# Patient Record
Sex: Male | Born: 1935 | Race: White | Hispanic: No | Marital: Married
Health system: Southern US, Community
[De-identification: ages and names within clinical notes are randomized; demographics above are authoritative.]

---

## 2003-11-01 ENCOUNTER — Ambulatory Visit: Payer: Self-pay | Admitting: Unknown Physician Specialty

## 2005-08-13 ENCOUNTER — Other Ambulatory Visit: Payer: Self-pay

## 2005-08-13 ENCOUNTER — Inpatient Hospital Stay: Payer: Self-pay | Admitting: Internal Medicine

## 2006-03-24 ENCOUNTER — Inpatient Hospital Stay: Payer: Self-pay | Admitting: Internal Medicine

## 2006-11-17 ENCOUNTER — Inpatient Hospital Stay: Payer: Self-pay | Admitting: Internal Medicine

## 2006-11-17 ENCOUNTER — Other Ambulatory Visit: Payer: Self-pay

## 2007-04-16 ENCOUNTER — Other Ambulatory Visit: Payer: Self-pay

## 2007-04-16 ENCOUNTER — Inpatient Hospital Stay: Payer: Self-pay | Admitting: Internal Medicine

## 2007-04-19 ENCOUNTER — Other Ambulatory Visit: Payer: Self-pay

## 2007-04-22 ENCOUNTER — Inpatient Hospital Stay: Payer: Self-pay | Admitting: Internal Medicine

## 2007-04-22 ENCOUNTER — Other Ambulatory Visit: Payer: Self-pay

## 2007-07-10 ENCOUNTER — Other Ambulatory Visit: Payer: Self-pay

## 2007-07-10 ENCOUNTER — Inpatient Hospital Stay: Payer: Self-pay | Admitting: Rheumatology

## 2007-09-01 ENCOUNTER — Emergency Department: Payer: Self-pay | Admitting: Emergency Medicine

## 2007-09-01 ENCOUNTER — Other Ambulatory Visit: Payer: Self-pay

## 2008-02-19 ENCOUNTER — Ambulatory Visit: Payer: Self-pay | Admitting: Internal Medicine

## 2008-11-24 ENCOUNTER — Ambulatory Visit: Payer: Self-pay | Admitting: Internal Medicine

## 2009-09-12 ENCOUNTER — Ambulatory Visit: Payer: Self-pay | Admitting: Internal Medicine

## 2011-02-12 LAB — URINALYSIS, COMPLETE
Bacteria: NONE SEEN
Bilirubin,UR: NEGATIVE
Blood: NEGATIVE
Glucose,UR: NEGATIVE mg/dL (ref 0–75)
Hyaline Cast: 10
Leukocyte Esterase: NEGATIVE
Nitrite: NEGATIVE
Ph: 5 (ref 4.5–8.0)
RBC,UR: <1 /HPF (ref 0–5)
Specific Gravity: 1.017 (ref 1.003–1.030)
Squamous Epithelial: <1

## 2011-02-12 LAB — CBC
HCT: 37.4 % — ABNORMAL LOW (ref 40.0–52.0)
HGB: 12.4 g/dL — ABNORMAL LOW (ref 13.0–18.0)
MCH: 30.2 pg (ref 26.0–34.0)
MCHC: 33.2 g/dL (ref 32.0–36.0)
MCV: 91 fL (ref 80–100)
Platelet: 153 10*3/uL (ref 150–440)
RBC: 4.11 10*6/uL — ABNORMAL LOW (ref 4.40–5.90)

## 2011-02-12 LAB — COMPREHENSIVE METABOLIC PANEL
Albumin: 4 g/dL (ref 3.4–5.0)
BUN: 39 mg/dL — ABNORMAL HIGH (ref 7–18)
Bilirubin,Total: 0.5 mg/dL (ref 0.2–1.0)
Chloride: 109 mmol/L — ABNORMAL HIGH (ref 98–107)
Creatinine: 2.31 mg/dL — ABNORMAL HIGH (ref 0.60–1.30)
EGFR (African American): 35 — ABNORMAL LOW
Glucose: 115 mg/dL — ABNORMAL HIGH (ref 65–99)
Potassium: 4.5 mmol/L (ref 3.5–5.1)
SGPT (ALT): 21 U/L
Total Protein: 7.8 g/dL (ref 6.4–8.2)

## 2011-02-12 LAB — TROPONIN I: Troponin-I: <0.02 ng/mL

## 2011-02-13 LAB — LIPID PANEL
Cholesterol: 112 mg/dL (ref 0–200)
Triglycerides: 161 mg/dL (ref 0–200)
VLDL Cholesterol, Calc: 32 mg/dL (ref 5–40)

## 2011-02-13 LAB — BASIC METABOLIC PANEL
Anion Gap: 10 (ref 7–16)
BUN: 39 mg/dL — ABNORMAL HIGH (ref 7–18)
Creatinine: 1.98 mg/dL — ABNORMAL HIGH (ref 0.60–1.30)
EGFR (African American): 42 — ABNORMAL LOW
EGFR (Non-African Amer.): 35 — ABNORMAL LOW
Sodium: 147 mmol/L — ABNORMAL HIGH (ref 136–145)

## 2011-02-14 ENCOUNTER — Inpatient Hospital Stay: Payer: Self-pay | Admitting: Internal Medicine

## 2011-02-14 LAB — CBC WITH DIFFERENTIAL/PLATELET
Basophil #: 0 10*3/uL (ref 0.0–0.1)
Basophil %: 0.3 %
HCT: 32.3 % — ABNORMAL LOW (ref 40.0–52.0)
HGB: 10.9 g/dL — ABNORMAL LOW (ref 13.0–18.0)
Lymphocyte #: 1.5 10*3/uL (ref 1.0–3.6)
Lymphocyte %: 28.5 %
MCH: 30.7 pg (ref 26.0–34.0)
MCV: 91 fL (ref 80–100)
Monocyte %: 10.6 %
Neutrophil #: 3.1 10*3/uL (ref 1.4–6.5)
Neutrophil %: 58 %
RDW: 13.7 % (ref 11.5–14.5)
WBC: 5.4 10*3/uL (ref 3.8–10.6)

## 2011-02-14 LAB — BASIC METABOLIC PANEL
Anion Gap: 10 (ref 7–16)
Calcium, Total: 8.3 mg/dL — ABNORMAL LOW (ref 8.5–10.1)
Chloride: 110 mmol/L — ABNORMAL HIGH (ref 98–107)
Co2: 26 mmol/L (ref 21–32)
Creatinine: 1.61 mg/dL — ABNORMAL HIGH (ref 0.60–1.30)
Osmolality: 297 (ref 275–301)
Potassium: 4.3 mmol/L (ref 3.5–5.1)

## 2012-01-29 ENCOUNTER — Emergency Department: Payer: Self-pay | Admitting: Emergency Medicine

## 2012-01-29 LAB — COMPREHENSIVE METABOLIC PANEL
Anion Gap: 6 — ABNORMAL LOW (ref 7–16)
Bilirubin,Total: 0.5 mg/dL (ref 0.2–1.0)
Chloride: 108 mmol/L — ABNORMAL HIGH (ref 98–107)
Co2: 27 mmol/L (ref 21–32)
Creatinine: 2.09 mg/dL — ABNORMAL HIGH (ref 0.60–1.30)
EGFR (African American): 33 — ABNORMAL LOW
EGFR (Non-African Amer.): 28 — ABNORMAL LOW
Osmolality: 293 (ref 275–301)
SGOT(AST): 21 U/L (ref 15–37)
Sodium: 141 mmol/L (ref 136–145)

## 2012-01-29 LAB — CBC
HCT: 38.6 % — ABNORMAL LOW (ref 40.0–52.0)
MCH: 30.5 pg (ref 26.0–34.0)
Platelet: 165 10*3/uL (ref 150–440)
RBC: 4.27 10*6/uL — ABNORMAL LOW (ref 4.40–5.90)
RDW: 13.9 % (ref 11.5–14.5)

## 2012-04-24 ENCOUNTER — Ambulatory Visit: Payer: Self-pay

## 2012-12-19 IMAGING — CT CT PELVIS W/O CM
1 series · 16 of 32 positions shown, 20 images · non-contrast
Comparison: None

REASON FOR EXAM: abdominal mass
COMMENTS:

PROCEDURE:     CT  - CT PELVIS STANDARD WO  - February 14, 2011 [DATE]
RESULT:     History: Abdominal mass
TECHNIQUE: Multiple axial images obtained of the pelvis with oral contrast
only.

[Series 3: soft tissue · axial · 0.74mm/px · z∈[-577,-312]mm · 16 of 59 slices shown, 20 images]
[im 4/59  soft-tissue]
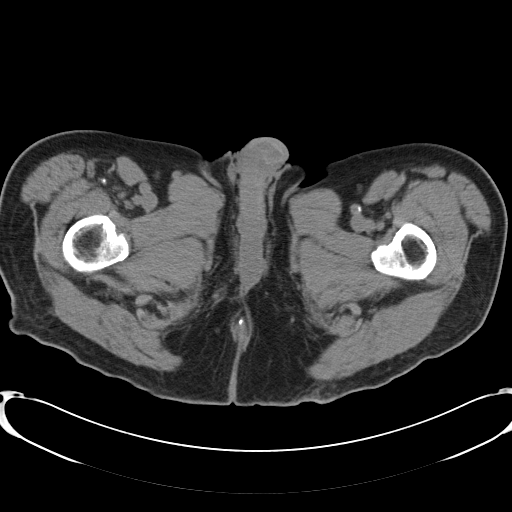
[im 4/59  bone]
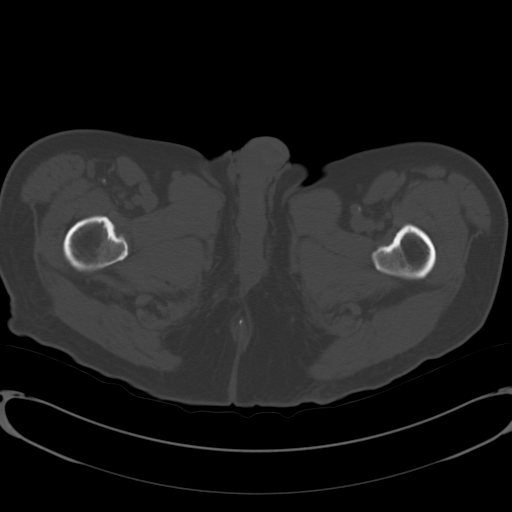
[im 8/59  soft-tissue]
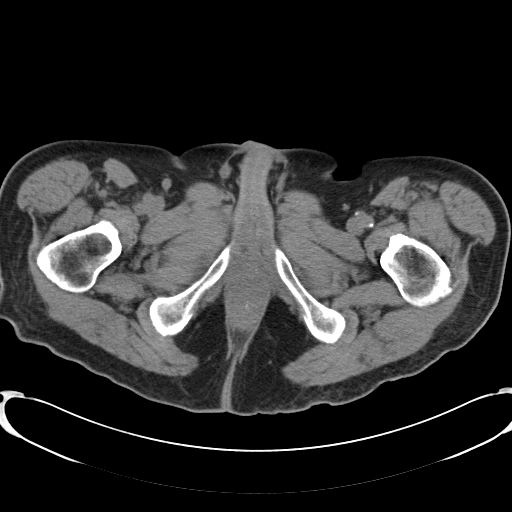
[im 12/59  soft-tissue]
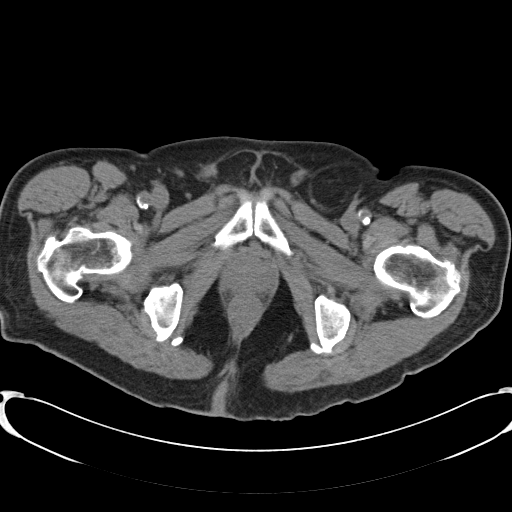
[im 15/59  soft-tissue]
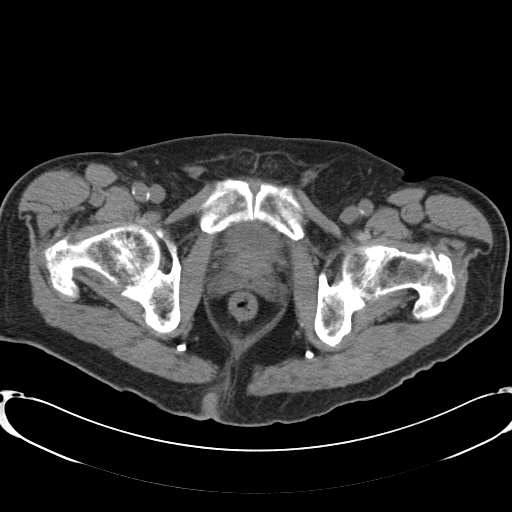
[im 19/59  soft-tissue]
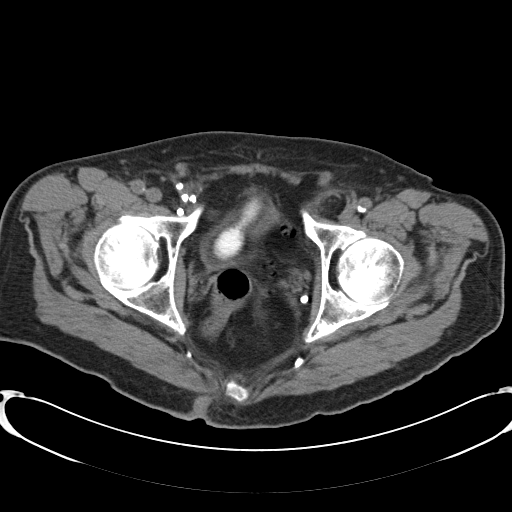
[im 23/59  soft-tissue]
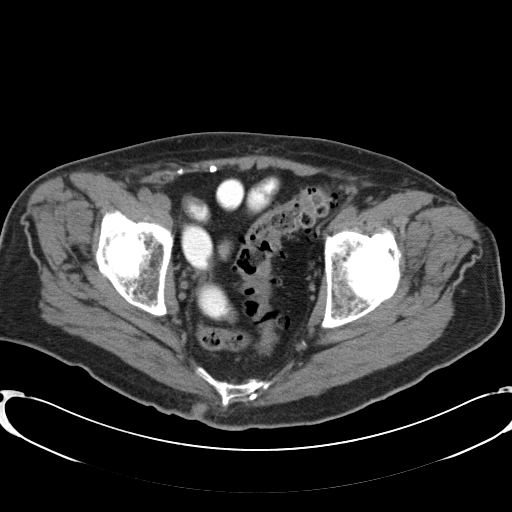
[im 27/59  soft-tissue]
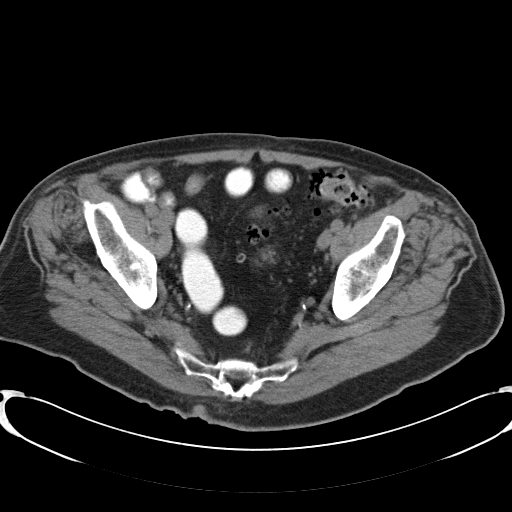
[im 32/59  soft-tissue]
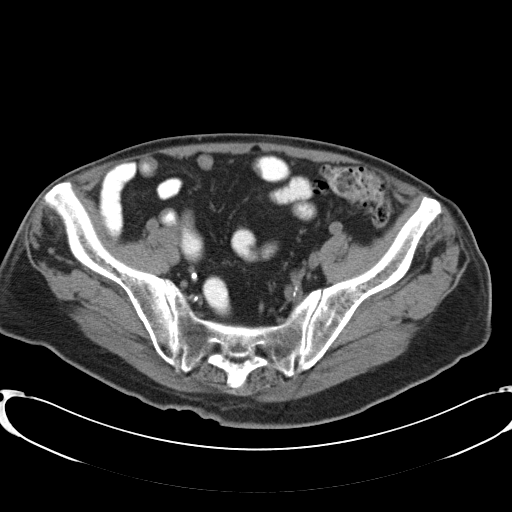
[im 36/59  soft-tissue]
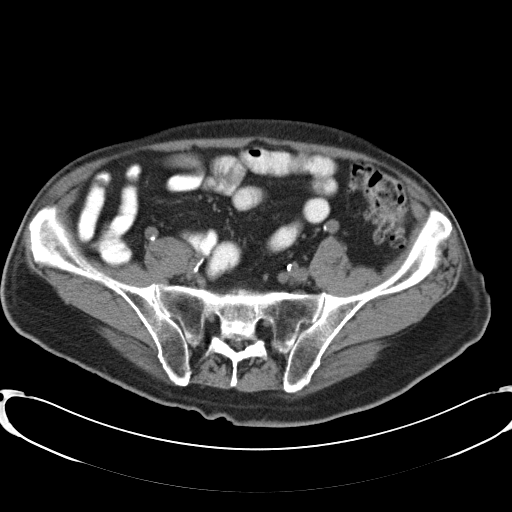
[im 36/59  bone]
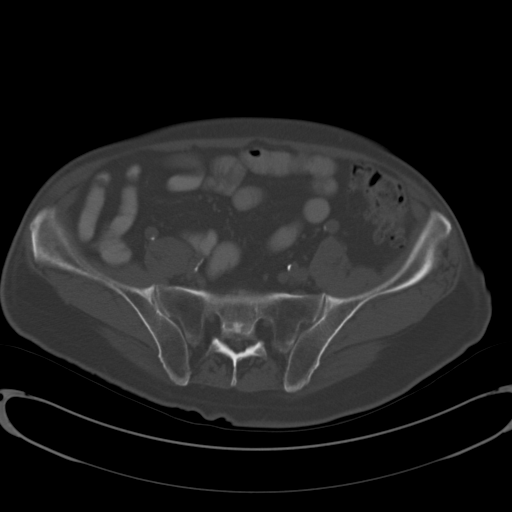
[im 40/59  soft-tissue]
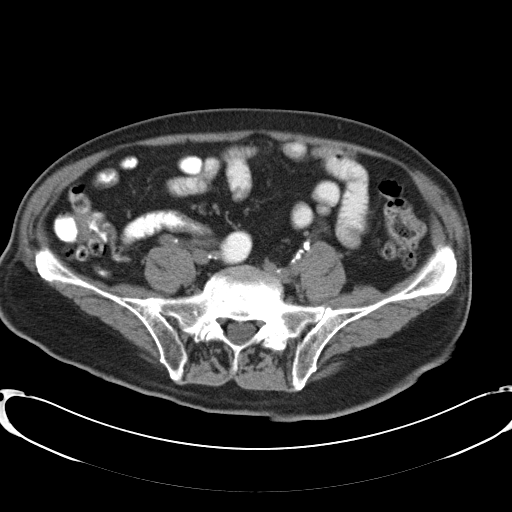
[im 44/59  soft-tissue]
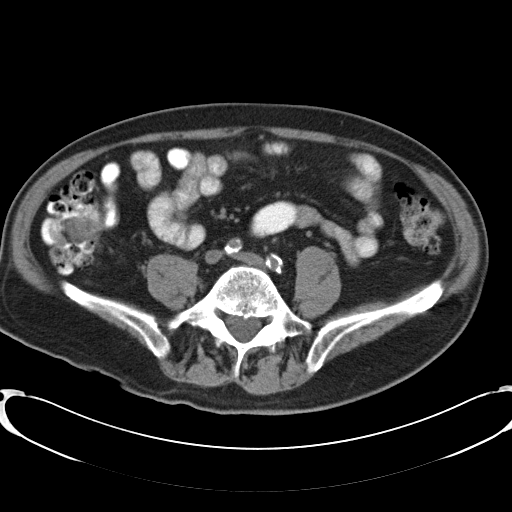
[im 47/59  soft-tissue]
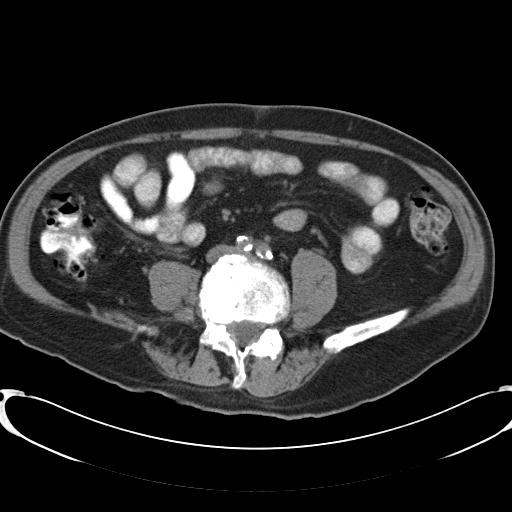
[im 51/59  soft-tissue]
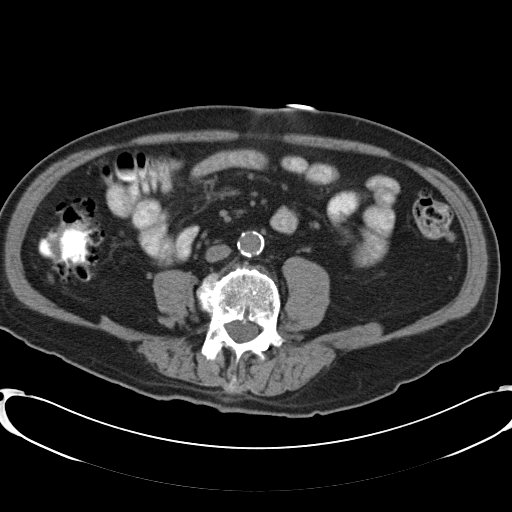
[im 51/59  lung]
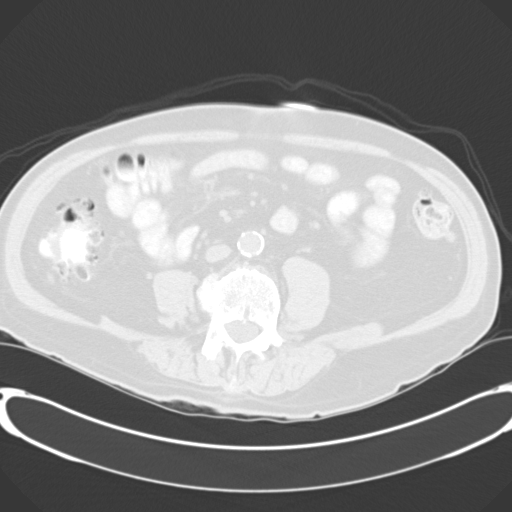
[im 53/59  lung]
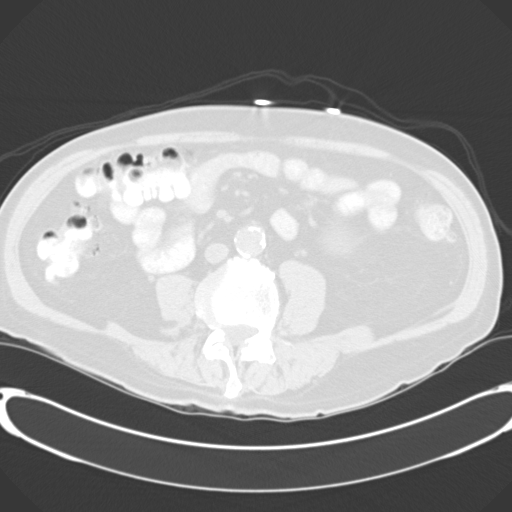
[im 55/59  soft-tissue]
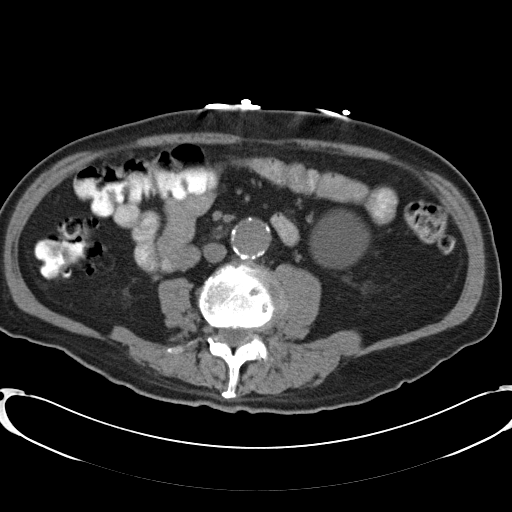
[im 55/59  lung]
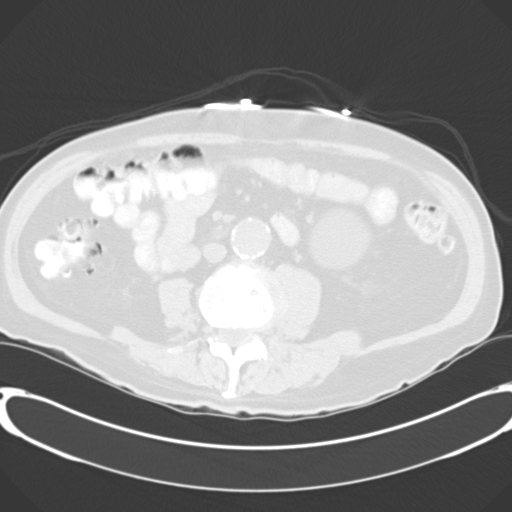
[im 57/59  lung]
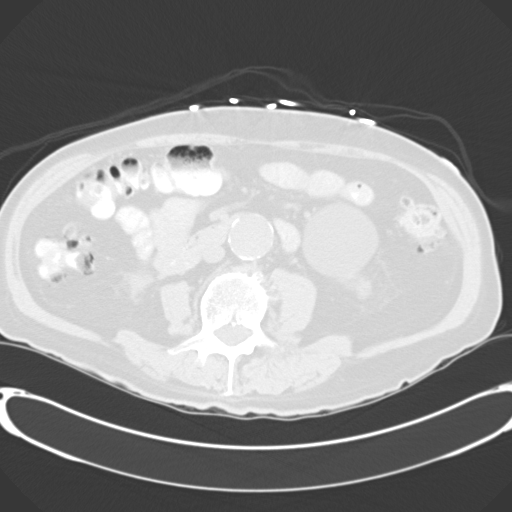

[16 of 32 positions shown; findings below may reference images not displayed]

FINDINGS: There is diverticulosis without evidence of diverticulitis. There is a
cm hypodense, fluid attenuating left inferior pole renal mass most
consistent with a cyst. The bladder is partially decompressed. There is no
evidence of a mass adjacent to the bladder as seen on prior ultrasound.
IMPRESSION: There is no evidence of a mass adjacent to the bladder as seen on prior
ultrasound. This may have represented a fluid-filled bowel loop adjacent to
the bladder on the ultrasound.

## 2013-06-16 ENCOUNTER — Ambulatory Visit: Payer: Self-pay

## 2013-06-29 ENCOUNTER — Ambulatory Visit: Payer: Self-pay | Admitting: Urology

## 2013-06-29 ENCOUNTER — Ambulatory Visit: Payer: Self-pay | Admitting: Radiation Oncology

## 2013-06-29 LAB — BASIC METABOLIC PANEL
Anion Gap: 7 (ref 7–16)
BUN: 49 mg/dL — ABNORMAL HIGH (ref 7–18)
CREATININE: 2.8 mg/dL — AB (ref 0.60–1.30)
Calcium, Total: 8.7 mg/dL (ref 8.5–10.1)
Chloride: 109 mmol/L — ABNORMAL HIGH (ref 98–107)
Co2: 27 mmol/L (ref 21–32)
EGFR (Non-African Amer.): 20 — ABNORMAL LOW
GFR CALC AF AMER: 23 — AB
Glucose: 111 mg/dL — ABNORMAL HIGH (ref 65–99)
OSMOLALITY: 299 (ref 275–301)
POTASSIUM: 4.7 mmol/L (ref 3.5–5.1)
Sodium: 143 mmol/L (ref 136–145)

## 2013-06-29 LAB — CBC WITH DIFFERENTIAL/PLATELET
Basophil #: 0 10*3/uL (ref 0.0–0.1)
Basophil %: 0.4 %
EOS PCT: 0.7 %
Eosinophil #: 0 10*3/uL (ref 0.0–0.7)
HCT: 34.9 % — ABNORMAL LOW (ref 40.0–52.0)
HGB: 11.9 g/dL — ABNORMAL LOW (ref 13.0–18.0)
Lymphocyte #: 1.2 10*3/uL (ref 1.0–3.6)
Lymphocyte %: 22.7 %
MCH: 30.9 pg (ref 26.0–34.0)
MCHC: 34 g/dL (ref 32.0–36.0)
MCV: 91 fL (ref 80–100)
MONO ABS: 0.6 x10 3/mm (ref 0.2–1.0)
Monocyte %: 10.7 %
NEUTROS ABS: 3.5 10*3/uL (ref 1.4–6.5)
Neutrophil %: 65.5 %
Platelet: 126 10*3/uL — ABNORMAL LOW (ref 150–440)
RBC: 3.84 10*6/uL — AB (ref 4.40–5.90)
RDW: 14.3 % (ref 11.5–14.5)
WBC: 5.3 10*3/uL (ref 3.8–10.6)

## 2013-07-01 ENCOUNTER — Emergency Department: Payer: Self-pay | Admitting: Emergency Medicine

## 2013-07-01 ENCOUNTER — Ambulatory Visit: Payer: Self-pay | Admitting: Urology

## 2013-07-02 LAB — PATHOLOGY REPORT

## 2013-07-21 ENCOUNTER — Ambulatory Visit: Payer: Self-pay | Admitting: Radiation Oncology

## 2013-07-28 LAB — COMPREHENSIVE METABOLIC PANEL
ALBUMIN: 3.8 g/dL (ref 3.4–5.0)
ANION GAP: 8 (ref 7–16)
Alkaline Phosphatase: 86 U/L
BUN: 49 mg/dL — AB (ref 7–18)
Bilirubin,Total: 0.5 mg/dL (ref 0.2–1.0)
CALCIUM: 9 mg/dL (ref 8.5–10.1)
CREATININE: 2.55 mg/dL — AB (ref 0.60–1.30)
Chloride: 107 mmol/L (ref 98–107)
Co2: 29 mmol/L (ref 21–32)
EGFR (African American): 26 — ABNORMAL LOW
GFR CALC NON AF AMER: 22 — AB
Glucose: 113 mg/dL — ABNORMAL HIGH (ref 65–99)
Osmolality: 301 (ref 275–301)
POTASSIUM: 5.5 mmol/L — AB (ref 3.5–5.1)
SGOT(AST): 24 U/L (ref 15–37)
SGPT (ALT): 20 U/L (ref 12–78)
SODIUM: 144 mmol/L (ref 136–145)
Total Protein: 7.5 g/dL (ref 6.4–8.2)

## 2013-07-28 LAB — CBC CANCER CENTER
Basophil #: 0 x10 3/mm (ref 0.0–0.1)
Basophil %: 0.6 %
Eosinophil #: 0.2 x10 3/mm (ref 0.0–0.7)
Eosinophil %: 3.9 %
HCT: 37.7 % — AB (ref 40.0–52.0)
HGB: 12.5 g/dL — ABNORMAL LOW (ref 13.0–18.0)
LYMPHS ABS: 1.5 x10 3/mm (ref 1.0–3.6)
LYMPHS PCT: 26.4 %
MCH: 30.1 pg (ref 26.0–34.0)
MCHC: 33.3 g/dL (ref 32.0–36.0)
MCV: 91 fL (ref 80–100)
MONO ABS: 0.6 x10 3/mm (ref 0.2–1.0)
MONOS PCT: 10.8 %
NEUTROS ABS: 3.3 x10 3/mm (ref 1.4–6.5)
NEUTROS PCT: 58.3 %
Platelet: 143 x10 3/mm — ABNORMAL LOW (ref 150–440)
RBC: 4.16 10*6/uL — ABNORMAL LOW (ref 4.40–5.90)
RDW: 13.8 % (ref 11.5–14.5)
WBC: 5.6 x10 3/mm (ref 3.8–10.6)

## 2013-07-29 ENCOUNTER — Ambulatory Visit: Payer: Self-pay | Admitting: Oncology

## 2013-07-29 ENCOUNTER — Ambulatory Visit: Payer: Self-pay | Admitting: Radiation Oncology

## 2013-08-12 LAB — CBC CANCER CENTER
Basophil #: 0 x10 3/mm (ref 0.0–0.1)
Basophil %: 0.6 %
Eosinophil #: 0.2 x10 3/mm (ref 0.0–0.7)
Eosinophil %: 3 %
HCT: 36.1 % — ABNORMAL LOW (ref 40.0–52.0)
HGB: 12.1 g/dL — ABNORMAL LOW (ref 13.0–18.0)
LYMPHS ABS: 1.5 x10 3/mm (ref 1.0–3.6)
LYMPHS PCT: 28.8 %
MCH: 30.2 pg (ref 26.0–34.0)
MCHC: 33.5 g/dL (ref 32.0–36.0)
MCV: 90 fL (ref 80–100)
MONO ABS: 0.6 x10 3/mm (ref 0.2–1.0)
MONOS PCT: 12.6 %
Neutrophil #: 2.8 x10 3/mm (ref 1.4–6.5)
Neutrophil %: 55 %
PLATELETS: 130 x10 3/mm — AB (ref 150–440)
RBC: 4.01 10*6/uL — AB (ref 4.40–5.90)
RDW: 14 % (ref 11.5–14.5)
WBC: 5.2 x10 3/mm (ref 3.8–10.6)

## 2013-08-12 LAB — COMPREHENSIVE METABOLIC PANEL
ANION GAP: 1 — AB (ref 7–16)
Albumin: 3.5 g/dL (ref 3.4–5.0)
Alkaline Phosphatase: 76 U/L
BUN: 45 mg/dL — ABNORMAL HIGH (ref 7–18)
Bilirubin,Total: 0.5 mg/dL (ref 0.2–1.0)
CREATININE: 2.39 mg/dL — AB (ref 0.60–1.30)
Calcium, Total: 8.6 mg/dL (ref 8.5–10.1)
Chloride: 110 mmol/L — ABNORMAL HIGH (ref 98–107)
Co2: 30 mmol/L (ref 21–32)
EGFR (African American): 28 — ABNORMAL LOW
GFR CALC NON AF AMER: 24 — AB
GLUCOSE: 154 mg/dL — AB (ref 65–99)
OSMOLALITY: 296 (ref 275–301)
POTASSIUM: 4.9 mmol/L (ref 3.5–5.1)
SGOT(AST): 20 U/L (ref 15–37)
SGPT (ALT): 17 U/L (ref 12–78)
SODIUM: 141 mmol/L (ref 136–145)
TOTAL PROTEIN: 7 g/dL (ref 6.4–8.2)

## 2013-08-19 LAB — CBC CANCER CENTER
BASOS ABS: 0 x10 3/mm (ref 0.0–0.1)
Basophil %: 0.4 %
EOS ABS: 0.2 x10 3/mm (ref 0.0–0.7)
Eosinophil %: 3.1 %
HCT: 36.1 % — ABNORMAL LOW (ref 40.0–52.0)
HGB: 11.8 g/dL — ABNORMAL LOW (ref 13.0–18.0)
LYMPHS PCT: 19.4 %
Lymphocyte #: 1 x10 3/mm (ref 1.0–3.6)
MCH: 29.6 pg (ref 26.0–34.0)
MCHC: 32.7 g/dL (ref 32.0–36.0)
MCV: 91 fL (ref 80–100)
Monocyte #: 0.5 x10 3/mm (ref 0.2–1.0)
Monocyte %: 10.1 %
NEUTROS ABS: 3.3 x10 3/mm (ref 1.4–6.5)
Neutrophil %: 67 %
PLATELETS: 132 x10 3/mm — AB (ref 150–440)
RBC: 3.98 10*6/uL — AB (ref 4.40–5.90)
RDW: 13.8 % (ref 11.5–14.5)
WBC: 4.9 x10 3/mm (ref 3.8–10.6)

## 2013-08-19 LAB — BASIC METABOLIC PANEL
ANION GAP: 7 (ref 7–16)
BUN: 47 mg/dL — ABNORMAL HIGH (ref 7–18)
CHLORIDE: 108 mmol/L — AB (ref 98–107)
Calcium, Total: 8.4 mg/dL — ABNORMAL LOW (ref 8.5–10.1)
Co2: 28 mmol/L (ref 21–32)
Creatinine: 2.27 mg/dL — ABNORMAL HIGH (ref 0.60–1.30)
GFR CALC AF AMER: 29 — AB
GFR CALC NON AF AMER: 25 — AB
GLUCOSE: 116 mg/dL — AB (ref 65–99)
Osmolality: 298 (ref 275–301)
Potassium: 5.1 mmol/L (ref 3.5–5.1)
SODIUM: 143 mmol/L (ref 136–145)

## 2013-08-26 LAB — CBC CANCER CENTER
Basophil #: 0 x10 3/mm (ref 0.0–0.1)
Basophil %: 0.2 %
Eosinophil #: 0.2 x10 3/mm (ref 0.0–0.7)
Eosinophil %: 3.6 %
HCT: 33.9 % — ABNORMAL LOW (ref 40.0–52.0)
HGB: 10.9 g/dL — ABNORMAL LOW (ref 13.0–18.0)
LYMPHS PCT: 7.7 %
Lymphocyte #: 0.4 x10 3/mm — ABNORMAL LOW (ref 1.0–3.6)
MCH: 29.6 pg (ref 26.0–34.0)
MCHC: 32.1 g/dL (ref 32.0–36.0)
MCV: 92 fL (ref 80–100)
MONO ABS: 0.6 x10 3/mm (ref 0.2–1.0)
Monocyte %: 11.9 %
NEUTROS PCT: 76.6 %
Neutrophil #: 4 x10 3/mm (ref 1.4–6.5)
PLATELETS: 102 x10 3/mm — AB (ref 150–440)
RBC: 3.68 10*6/uL — ABNORMAL LOW (ref 4.40–5.90)
RDW: 14.3 % (ref 11.5–14.5)
WBC: 5.1 x10 3/mm (ref 3.8–10.6)

## 2013-08-26 LAB — COMPREHENSIVE METABOLIC PANEL
ALBUMIN: 3.1 g/dL — AB (ref 3.4–5.0)
AST: 20 U/L (ref 15–37)
Alkaline Phosphatase: 86 U/L
Anion Gap: 6 — ABNORMAL LOW (ref 7–16)
BUN: 49 mg/dL — ABNORMAL HIGH (ref 7–18)
Bilirubin,Total: 0.5 mg/dL (ref 0.2–1.0)
CHLORIDE: 109 mmol/L — AB (ref 98–107)
Calcium, Total: 8.7 mg/dL (ref 8.5–10.1)
Co2: 28 mmol/L (ref 21–32)
Creatinine: 2.18 mg/dL — ABNORMAL HIGH (ref 0.60–1.30)
EGFR (Non-African Amer.): 27 — ABNORMAL LOW
GFR CALC AF AMER: 31 — AB
Glucose: 174 mg/dL — ABNORMAL HIGH (ref 65–99)
Osmolality: 302 (ref 275–301)
POTASSIUM: 5.1 mmol/L (ref 3.5–5.1)
SGPT (ALT): 25 U/L
Sodium: 143 mmol/L (ref 136–145)
Total Protein: 6.6 g/dL (ref 6.4–8.2)

## 2013-08-29 ENCOUNTER — Ambulatory Visit: Payer: Self-pay | Admitting: Radiation Oncology

## 2013-08-29 ENCOUNTER — Ambulatory Visit: Payer: Self-pay | Admitting: Oncology

## 2013-09-02 LAB — BASIC METABOLIC PANEL
ANION GAP: 6 — AB (ref 7–16)
BUN: 43 mg/dL — ABNORMAL HIGH (ref 7–18)
CO2: 27 mmol/L (ref 21–32)
Calcium, Total: 8.3 mg/dL — ABNORMAL LOW (ref 8.5–10.1)
Chloride: 110 mmol/L — ABNORMAL HIGH (ref 98–107)
Creatinine: 1.91 mg/dL — ABNORMAL HIGH (ref 0.60–1.30)
EGFR (Non-African Amer.): 31 — ABNORMAL LOW
GFR CALC AF AMER: 36 — AB
Glucose: 104 mg/dL — ABNORMAL HIGH (ref 65–99)
Osmolality: 296 (ref 275–301)
Potassium: 4.9 mmol/L (ref 3.5–5.1)
Sodium: 143 mmol/L (ref 136–145)

## 2013-09-02 LAB — CBC CANCER CENTER
Basophil #: 0 x10 3/mm (ref 0.0–0.1)
Basophil %: 0.2 %
Eosinophil #: 0.2 x10 3/mm (ref 0.0–0.7)
Eosinophil %: 3.6 %
HCT: 31.7 % — ABNORMAL LOW (ref 40.0–52.0)
HGB: 10.3 g/dL — ABNORMAL LOW (ref 13.0–18.0)
Lymphocyte #: 0.4 x10 3/mm — ABNORMAL LOW (ref 1.0–3.6)
Lymphocyte %: 7.3 %
MCH: 29.8 pg (ref 26.0–34.0)
MCHC: 32.5 g/dL (ref 32.0–36.0)
MCV: 92 fL (ref 80–100)
MONO ABS: 0.7 x10 3/mm (ref 0.2–1.0)
MONOS PCT: 11.8 %
NEUTROS PCT: 77.1 %
Neutrophil #: 4.5 x10 3/mm (ref 1.4–6.5)
Platelet: 97 x10 3/mm — ABNORMAL LOW (ref 150–440)
RBC: 3.45 10*6/uL — ABNORMAL LOW (ref 4.40–5.90)
RDW: 14.5 % (ref 11.5–14.5)
WBC: 5.9 x10 3/mm (ref 3.8–10.6)

## 2013-09-07 LAB — CBC CANCER CENTER
Basophil #: 0 x10 3/mm (ref 0.0–0.1)
Basophil %: 0.2 %
Eosinophil #: 0.3 x10 3/mm (ref 0.0–0.7)
Eosinophil %: 4.8 %
HCT: 32.9 % — ABNORMAL LOW (ref 40.0–52.0)
HGB: 10.9 g/dL — ABNORMAL LOW (ref 13.0–18.0)
Lymphocyte %: 7 %
Lymphs Abs: 0.5 x10 3/mm — ABNORMAL LOW (ref 1.0–3.6)
MCH: 30 pg (ref 26.0–34.0)
MCHC: 33 g/dL (ref 32.0–36.0)
MCV: 91 fL (ref 80–100)
Monocyte #: 0.6 x10 3/mm (ref 0.2–1.0)
Monocyte %: 9 %
Neutrophil #: 5.6 x10 3/mm (ref 1.4–6.5)
Neutrophil %: 79 %
Platelet: 129 x10 3/mm — ABNORMAL LOW (ref 150–440)
RBC: 3.62 x10 6/mm — ABNORMAL LOW (ref 4.40–5.90)
RDW: 14.7 % — ABNORMAL HIGH (ref 11.5–14.5)
WBC: 7.1 x10 3/mm (ref 3.8–10.6)

## 2013-09-07 LAB — URINALYSIS, COMPLETE
BILIRUBIN, UR: NEGATIVE
Bacteria: NONE SEEN
Blood: NEGATIVE
Glucose,UR: NEGATIVE mg/dL (ref 0–75)
Hyaline Cast: 8
KETONE: NEGATIVE
LEUKOCYTE ESTERASE: NEGATIVE
NITRITE: POSITIVE
Ph: 5 (ref 4.5–8.0)
Protein: 30
Specific Gravity: 1.017 (ref 1.003–1.030)
WBC UR: 8 /HPF (ref 0–5)

## 2013-09-07 LAB — COMPREHENSIVE METABOLIC PANEL WITH GFR
Albumin: 2.9 g/dL — ABNORMAL LOW (ref 3.4–5.0)
Alkaline Phosphatase: 91 U/L
Anion Gap: 8 (ref 7–16)
BUN: 50 mg/dL — ABNORMAL HIGH (ref 7–18)
Bilirubin,Total: 0.3 mg/dL (ref 0.2–1.0)
Calcium, Total: 8.5 mg/dL (ref 8.5–10.1)
Chloride: 107 mmol/L (ref 98–107)
Co2: 27 mmol/L (ref 21–32)
Creatinine: 2.18 mg/dL — ABNORMAL HIGH (ref 0.60–1.30)
EGFR (African American): 31 — ABNORMAL LOW
EGFR (Non-African Amer.): 27 — ABNORMAL LOW
Glucose: 150 mg/dL — ABNORMAL HIGH (ref 65–99)
Osmolality: 299 (ref 275–301)
Potassium: 4.4 mmol/L (ref 3.5–5.1)
SGOT(AST): 19 U/L (ref 15–37)
SGPT (ALT): 25 U/L
Sodium: 142 mmol/L (ref 136–145)
Total Protein: 6.6 g/dL (ref 6.4–8.2)

## 2013-09-08 LAB — URINE CULTURE

## 2013-09-10 LAB — COMPREHENSIVE METABOLIC PANEL
ALBUMIN: 2.9 g/dL — AB (ref 3.4–5.0)
Alkaline Phosphatase: 92 U/L
Anion Gap: 11 (ref 7–16)
BUN: 44 mg/dL — ABNORMAL HIGH (ref 7–18)
Bilirubin,Total: 0.5 mg/dL (ref 0.2–1.0)
CALCIUM: 8 mg/dL — AB (ref 8.5–10.1)
CHLORIDE: 109 mmol/L — AB (ref 98–107)
CO2: 24 mmol/L (ref 21–32)
CREATININE: 2.21 mg/dL — AB (ref 0.60–1.30)
EGFR (African American): 30 — ABNORMAL LOW
EGFR (Non-African Amer.): 26 — ABNORMAL LOW
Glucose: 117 mg/dL — ABNORMAL HIGH (ref 65–99)
OSMOLALITY: 299 (ref 275–301)
POTASSIUM: 4.5 mmol/L (ref 3.5–5.1)
SGOT(AST): 22 U/L (ref 15–37)
SGPT (ALT): 25 U/L
Sodium: 144 mmol/L (ref 136–145)
TOTAL PROTEIN: 6.5 g/dL (ref 6.4–8.2)

## 2013-09-10 LAB — CBC CANCER CENTER
BASOS PCT: 0.3 %
Basophil #: 0 x10 3/mm (ref 0.0–0.1)
EOS PCT: 1.6 %
Eosinophil #: 0.1 x10 3/mm (ref 0.0–0.7)
HCT: 31.2 % — AB (ref 40.0–52.0)
HGB: 10.2 g/dL — ABNORMAL LOW (ref 13.0–18.0)
Lymphocyte #: 0.4 x10 3/mm — ABNORMAL LOW (ref 1.0–3.6)
Lymphocyte %: 6.3 %
MCH: 29.9 pg (ref 26.0–34.0)
MCHC: 32.8 g/dL (ref 32.0–36.0)
MCV: 91 fL (ref 80–100)
MONO ABS: 0.6 x10 3/mm (ref 0.2–1.0)
MONOS PCT: 10.8 %
NEUTROS ABS: 4.8 x10 3/mm (ref 1.4–6.5)
Neutrophil %: 81 %
PLATELETS: 127 x10 3/mm — AB (ref 150–440)
RBC: 3.42 10*6/uL — AB (ref 4.40–5.90)
RDW: 14.5 % (ref 11.5–14.5)
WBC: 5.9 x10 3/mm (ref 3.8–10.6)

## 2013-09-17 LAB — COMPREHENSIVE METABOLIC PANEL
ALBUMIN: 3 g/dL — AB (ref 3.4–5.0)
ALK PHOS: 81 U/L
ANION GAP: 9 (ref 7–16)
AST: 22 U/L (ref 15–37)
BUN: 35 mg/dL — ABNORMAL HIGH (ref 7–18)
Bilirubin,Total: 0.3 mg/dL (ref 0.2–1.0)
CALCIUM: 8.1 mg/dL — AB (ref 8.5–10.1)
CHLORIDE: 109 mmol/L — AB (ref 98–107)
CREATININE: 1.89 mg/dL — AB (ref 0.60–1.30)
Co2: 27 mmol/L (ref 21–32)
EGFR (African American): 37 — ABNORMAL LOW
GFR CALC NON AF AMER: 32 — AB
GLUCOSE: 175 mg/dL — AB (ref 65–99)
Osmolality: 301 (ref 275–301)
Potassium: 4.6 mmol/L (ref 3.5–5.1)
SGPT (ALT): 23 U/L
SODIUM: 145 mmol/L (ref 136–145)
Total Protein: 6.5 g/dL (ref 6.4–8.2)

## 2013-09-17 LAB — CBC CANCER CENTER
BASOS ABS: 0 x10 3/mm (ref 0.0–0.1)
Basophil %: 0.4 %
EOS ABS: 0.1 x10 3/mm (ref 0.0–0.7)
Eosinophil %: 2.4 %
HCT: 30.9 % — AB (ref 40.0–52.0)
HGB: 10.2 g/dL — ABNORMAL LOW (ref 13.0–18.0)
LYMPHS PCT: 8.8 %
Lymphocyte #: 0.3 x10 3/mm — ABNORMAL LOW (ref 1.0–3.6)
MCH: 30.2 pg (ref 26.0–34.0)
MCHC: 33 g/dL (ref 32.0–36.0)
MCV: 91 fL (ref 80–100)
MONO ABS: 0.5 x10 3/mm (ref 0.2–1.0)
MONOS PCT: 13 %
Neutrophil #: 2.8 x10 3/mm (ref 1.4–6.5)
Neutrophil %: 75.4 %
Platelet: 124 x10 3/mm — ABNORMAL LOW (ref 150–440)
RBC: 3.38 10*6/uL — AB (ref 4.40–5.90)
RDW: 15.6 % — AB (ref 11.5–14.5)
WBC: 3.7 x10 3/mm — ABNORMAL LOW (ref 3.8–10.6)

## 2013-09-29 ENCOUNTER — Ambulatory Visit: Payer: Self-pay | Admitting: Radiation Oncology

## 2013-09-29 ENCOUNTER — Ambulatory Visit: Payer: Self-pay | Admitting: Oncology

## 2013-09-30 LAB — URINALYSIS, COMPLETE
BACTERIA: NONE SEEN
Bilirubin,UR: NEGATIVE
Blood: NEGATIVE
Glucose,UR: NEGATIVE mg/dL (ref 0–75)
Hyaline Cast: 10
Ketone: NEGATIVE
Leukocyte Esterase: NEGATIVE
Nitrite: POSITIVE
PH: 5 (ref 4.5–8.0)
Specific Gravity: 1.017 (ref 1.003–1.030)
Squamous Epithelial: 1
WBC UR: 6 /HPF (ref 0–5)

## 2013-10-01 LAB — URINE CULTURE

## 2013-10-27 LAB — COMPREHENSIVE METABOLIC PANEL
ALK PHOS: 78 U/L
ALT: 27 U/L
ANION GAP: 10 (ref 7–16)
AST: 21 U/L (ref 15–37)
Albumin: 3.3 g/dL — ABNORMAL LOW (ref 3.4–5.0)
BUN: 54 mg/dL — ABNORMAL HIGH (ref 7–18)
Bilirubin,Total: 0.4 mg/dL (ref 0.2–1.0)
CALCIUM: 8.4 mg/dL — AB (ref 8.5–10.1)
CO2: 23 mmol/L (ref 21–32)
Chloride: 111 mmol/L — ABNORMAL HIGH (ref 98–107)
Creatinine: 2.34 mg/dL — ABNORMAL HIGH (ref 0.60–1.30)
EGFR (African American): 34 — ABNORMAL LOW
GFR CALC NON AF AMER: 28 — AB
Glucose: 190 mg/dL — ABNORMAL HIGH (ref 65–99)
Osmolality: 307 (ref 275–301)
Potassium: 4.9 mmol/L (ref 3.5–5.1)
Sodium: 144 mmol/L (ref 136–145)
TOTAL PROTEIN: 6.2 g/dL — AB (ref 6.4–8.2)

## 2013-10-27 LAB — CBC CANCER CENTER
Basophil #: 0 x10 3/mm (ref 0.0–0.1)
Basophil %: 0.6 %
Eosinophil #: 0.1 x10 3/mm (ref 0.0–0.7)
Eosinophil %: 2.8 %
HCT: 30.8 % — AB (ref 40.0–52.0)
HGB: 10 g/dL — AB (ref 13.0–18.0)
LYMPHS PCT: 14.5 %
Lymphocyte #: 0.5 x10 3/mm — ABNORMAL LOW (ref 1.0–3.6)
MCH: 30.7 pg (ref 26.0–34.0)
MCHC: 32.5 g/dL (ref 32.0–36.0)
MCV: 95 fL (ref 80–100)
Monocyte #: 0.5 x10 3/mm (ref 0.2–1.0)
Monocyte %: 15.3 %
NEUTROS ABS: 2.2 x10 3/mm (ref 1.4–6.5)
Neutrophil %: 66.8 %
Platelet: 114 x10 3/mm — ABNORMAL LOW (ref 150–440)
RBC: 3.26 10*6/uL — ABNORMAL LOW (ref 4.40–5.90)
RDW: 15.6 % — AB (ref 11.5–14.5)
WBC: 3.2 x10 3/mm — AB (ref 3.8–10.6)

## 2013-10-29 ENCOUNTER — Ambulatory Visit: Payer: Self-pay | Admitting: Oncology

## 2013-10-29 ENCOUNTER — Ambulatory Visit: Payer: Self-pay | Admitting: Radiation Oncology

## 2013-11-06 LAB — CBC CANCER CENTER
Basophil #: 0 x10 3/mm (ref 0.0–0.1)
Basophil %: 0.3 %
Eosinophil #: 0.1 x10 3/mm (ref 0.0–0.7)
Eosinophil %: 2.5 %
HCT: 32 % — ABNORMAL LOW (ref 40.0–52.0)
HGB: 10.5 g/dL — ABNORMAL LOW (ref 13.0–18.0)
Lymphocyte #: 0.7 x10 3/mm — ABNORMAL LOW (ref 1.0–3.6)
Lymphocyte %: 15.3 %
MCH: 31 pg (ref 26.0–34.0)
MCHC: 32.9 g/dL (ref 32.0–36.0)
MCV: 94 fL (ref 80–100)
MONOS PCT: 16.5 %
Monocyte #: 0.7 x10 3/mm (ref 0.2–1.0)
NEUTROS PCT: 65.4 %
Neutrophil #: 2.8 x10 3/mm (ref 1.4–6.5)
PLATELETS: 127 x10 3/mm — AB (ref 150–440)
RBC: 3.4 10*6/uL — AB (ref 4.40–5.90)
RDW: 15.4 % — AB (ref 11.5–14.5)
WBC: 4.3 x10 3/mm (ref 3.8–10.6)

## 2013-11-29 ENCOUNTER — Ambulatory Visit: Payer: Self-pay | Admitting: Radiation Oncology

## 2013-12-15 LAB — CBC CANCER CENTER
Basophil #: 0 x10 3/mm (ref 0.0–0.1)
Basophil %: 0.3 %
EOS ABS: 0.1 x10 3/mm (ref 0.0–0.7)
Eosinophil %: 2.1 %
HCT: 34.7 % — AB (ref 40.0–52.0)
HGB: 11.4 g/dL — ABNORMAL LOW (ref 13.0–18.0)
LYMPHS ABS: 0.7 x10 3/mm — AB (ref 1.0–3.6)
Lymphocyte %: 12 %
MCH: 30.6 pg (ref 26.0–34.0)
MCHC: 32.9 g/dL (ref 32.0–36.0)
MCV: 93 fL (ref 80–100)
Monocyte #: 0.5 x10 3/mm (ref 0.2–1.0)
Monocyte %: 8.4 %
NEUTROS ABS: 4.5 x10 3/mm (ref 1.4–6.5)
Neutrophil %: 77.2 %
PLATELETS: 122 x10 3/mm — AB (ref 150–440)
RBC: 3.73 10*6/uL — ABNORMAL LOW (ref 4.40–5.90)
RDW: 13 % (ref 11.5–14.5)
WBC: 5.8 x10 3/mm (ref 3.8–10.6)

## 2013-12-15 LAB — COMPREHENSIVE METABOLIC PANEL
ALK PHOS: 93 U/L
ANION GAP: 5 — AB (ref 7–16)
Albumin: 3.5 g/dL (ref 3.4–5.0)
BUN: 53 mg/dL — AB (ref 7–18)
Bilirubin,Total: 0.4 mg/dL (ref 0.2–1.0)
Calcium, Total: 8.6 mg/dL (ref 8.5–10.1)
Chloride: 111 mmol/L — ABNORMAL HIGH (ref 98–107)
Co2: 27 mmol/L (ref 21–32)
Creatinine: 2.73 mg/dL — ABNORMAL HIGH (ref 0.60–1.30)
GFR CALC AF AMER: 29 — AB
GFR CALC NON AF AMER: 24 — AB
Glucose: 158 mg/dL — ABNORMAL HIGH (ref 65–99)
Osmolality: 303 (ref 275–301)
Potassium: 4.7 mmol/L (ref 3.5–5.1)
SGOT(AST): 20 U/L (ref 15–37)
SGPT (ALT): 21 U/L
SODIUM: 143 mmol/L (ref 136–145)
Total Protein: 6.7 g/dL (ref 6.4–8.2)

## 2013-12-29 ENCOUNTER — Ambulatory Visit: Payer: Self-pay | Admitting: Radiation Oncology

## 2013-12-29 ENCOUNTER — Ambulatory Visit: Payer: Self-pay | Admitting: Oncology

## 2013-12-30 ENCOUNTER — Ambulatory Visit: Payer: Self-pay | Admitting: Oncology

## 2014-01-29 ENCOUNTER — Ambulatory Visit: Payer: Self-pay | Admitting: Radiation Oncology

## 2014-01-29 ENCOUNTER — Ambulatory Visit: Payer: Self-pay | Admitting: Oncology

## 2014-02-04 LAB — CBC CANCER CENTER
Basophil #: 0 x10 3/mm (ref 0.0–0.1)
Basophil %: 0.4 %
EOS ABS: 0.1 x10 3/mm (ref 0.0–0.7)
Eosinophil %: 1.1 %
HCT: 35.3 % — ABNORMAL LOW (ref 40.0–52.0)
HGB: 11.3 g/dL — ABNORMAL LOW (ref 13.0–18.0)
LYMPHS PCT: 11.2 %
Lymphocyte #: 0.7 x10 3/mm — ABNORMAL LOW (ref 1.0–3.6)
MCH: 28.8 pg (ref 26.0–34.0)
MCHC: 32 g/dL (ref 32.0–36.0)
MCV: 90 fL (ref 80–100)
Monocyte #: 0.7 x10 3/mm (ref 0.2–1.0)
Monocyte %: 12.2 %
Neutrophil #: 4.5 x10 3/mm (ref 1.4–6.5)
Neutrophil %: 75.1 %
Platelet: 170 x10 3/mm (ref 150–440)
RBC: 3.93 10*6/uL — ABNORMAL LOW (ref 4.40–5.90)
RDW: 14 % (ref 11.5–14.5)
WBC: 6 x10 3/mm (ref 3.8–10.6)

## 2014-02-04 LAB — COMPREHENSIVE METABOLIC PANEL
ANION GAP: 11 (ref 7–16)
Albumin: 3.2 g/dL — ABNORMAL LOW (ref 3.4–5.0)
Alkaline Phosphatase: 250 U/L — ABNORMAL HIGH
BUN: 59 mg/dL — AB (ref 7–18)
Bilirubin,Total: 0.5 mg/dL (ref 0.2–1.0)
CALCIUM: 8.5 mg/dL (ref 8.5–10.1)
Chloride: 107 mmol/L (ref 98–107)
Co2: 26 mmol/L (ref 21–32)
Creatinine: 4.7 mg/dL — ABNORMAL HIGH (ref 0.60–1.30)
EGFR (Non-African Amer.): 13 — ABNORMAL LOW
GFR CALC AF AMER: 15 — AB
Glucose: 116 mg/dL — ABNORMAL HIGH (ref 65–99)
Osmolality: 304 (ref 275–301)
POTASSIUM: 5 mmol/L (ref 3.5–5.1)
SGOT(AST): 142 U/L — ABNORMAL HIGH (ref 15–37)
SGPT (ALT): 128 U/L — ABNORMAL HIGH
Sodium: 144 mmol/L (ref 136–145)
TOTAL PROTEIN: 7.2 g/dL (ref 6.4–8.2)

## 2014-02-10 ENCOUNTER — Inpatient Hospital Stay: Payer: Self-pay | Admitting: Oncology

## 2014-02-10 LAB — URINALYSIS, COMPLETE
Bacteria: NONE SEEN
Bilirubin,UR: NEGATIVE
Glucose,UR: NEGATIVE mg/dL (ref 0–75)
Ketone: NEGATIVE
Nitrite: NEGATIVE
PH: 6 (ref 4.5–8.0)
Protein: 30
RBC,UR: 101 /HPF (ref 0–5)
Specific Gravity: 1.013 (ref 1.003–1.030)
Squamous Epithelial: <1

## 2014-02-10 LAB — BASIC METABOLIC PANEL
ANION GAP: 9 (ref 7–16)
Anion Gap: 8 (ref 7–16)
BUN: 71 mg/dL — ABNORMAL HIGH (ref 7–18)
BUN: 66 mg/dL — AB (ref 7–18)
CALCIUM: 8.2 mg/dL — AB (ref 8.5–10.1)
CHLORIDE: 111 mmol/L — AB (ref 98–107)
CO2: 24 mmol/L (ref 21–32)
CREATININE: 6.87 mg/dL — AB (ref 0.60–1.30)
CREATININE: 6.65 mg/dL — AB (ref 0.60–1.30)
Calcium, Total: 8.1 mg/dL — ABNORMAL LOW (ref 8.5–10.1)
Chloride: 108 mmol/L — ABNORMAL HIGH (ref 98–107)
Co2: 24 mmol/L (ref 21–32)
EGFR (African American): 10 — ABNORMAL LOW
EGFR (African American): 10 — ABNORMAL LOW
EGFR (Non-African Amer.): 8 — ABNORMAL LOW
GFR CALC NON AF AMER: 8 — AB
GLUCOSE: 101 mg/dL — AB (ref 65–99)
Glucose: 130 mg/dL — ABNORMAL HIGH (ref 65–99)
OSMOLALITY: 306 (ref 275–301)
Osmolality: 302 (ref 275–301)
POTASSIUM: 5.2 mmol/L — AB (ref 3.5–5.1)
Potassium: 6 mmol/L — ABNORMAL HIGH (ref 3.5–5.1)
SODIUM: 143 mmol/L (ref 136–145)
Sodium: 141 mmol/L (ref 136–145)

## 2014-02-10 LAB — CANCER CENTER HEMOGLOBIN: HGB: 10.9 g/dL — ABNORMAL LOW (ref 13.0–18.0)

## 2014-02-11 LAB — CBC WITH DIFFERENTIAL/PLATELET
BASOS ABS: 0 10*3/uL (ref 0.0–0.1)
Basophil %: 0.2 %
EOS ABS: 0 10*3/uL (ref 0.0–0.7)
Eosinophil %: 0 %
HCT: 34.2 % — AB (ref 40.0–52.0)
HGB: 11.2 g/dL — AB (ref 13.0–18.0)
Lymphocyte #: 0.3 10*3/uL — ABNORMAL LOW (ref 1.0–3.6)
Lymphocyte %: 3.6 %
MCH: 29.3 pg (ref 26.0–34.0)
MCHC: 32.6 g/dL (ref 32.0–36.0)
MCV: 90 fL (ref 80–100)
Monocyte #: 0.7 x10 3/mm (ref 0.2–1.0)
Monocyte %: 7.6 %
Neutrophil #: 8.3 10*3/uL — ABNORMAL HIGH (ref 1.4–6.5)
Neutrophil %: 88.6 %
Platelet: 184 10*3/uL (ref 150–440)
RBC: 3.81 10*6/uL — ABNORMAL LOW (ref 4.40–5.90)
RDW: 14.5 % (ref 11.5–14.5)
WBC: 9.4 10*3/uL (ref 3.8–10.6)

## 2014-02-11 LAB — COMPREHENSIVE METABOLIC PANEL
AST: 118 U/L — AB (ref 15–37)
Albumin: 2.5 g/dL — ABNORMAL LOW (ref 3.4–5.0)
Alkaline Phosphatase: 279 U/L — ABNORMAL HIGH
Anion Gap: 10 (ref 7–16)
BUN: 63 mg/dL — AB (ref 7–18)
Bilirubin,Total: 0.7 mg/dL (ref 0.2–1.0)
CO2: 25 mmol/L (ref 21–32)
Calcium, Total: 8.2 mg/dL — ABNORMAL LOW (ref 8.5–10.1)
Chloride: 109 mmol/L — ABNORMAL HIGH (ref 98–107)
Creatinine: 6.54 mg/dL — ABNORMAL HIGH (ref 0.60–1.30)
GFR CALC AF AMER: 10 — AB
GFR CALC NON AF AMER: 9 — AB
Glucose: 147 mg/dL — ABNORMAL HIGH (ref 65–99)
Osmolality: 308 (ref 275–301)
Potassium: 5 mmol/L (ref 3.5–5.1)
SGPT (ALT): 106 U/L — ABNORMAL HIGH
Sodium: 144 mmol/L (ref 136–145)
TOTAL PROTEIN: 6.4 g/dL (ref 6.4–8.2)

## 2014-02-11 LAB — PROTIME-INR
INR: 1.2
PROTHROMBIN TIME: 14.9 s — AB (ref 11.5–14.7)

## 2014-02-11 LAB — MAGNESIUM: MAGNESIUM: 2.3 mg/dL

## 2014-02-12 LAB — COMPREHENSIVE METABOLIC PANEL
ALBUMIN: 2.2 g/dL — AB (ref 3.4–5.0)
ALT: 96 U/L — AB
Alkaline Phosphatase: 241 U/L — ABNORMAL HIGH
Anion Gap: 8 (ref 7–16)
BILIRUBIN TOTAL: 0.5 mg/dL (ref 0.2–1.0)
BUN: 68 mg/dL — ABNORMAL HIGH (ref 7–18)
Calcium, Total: 7.4 mg/dL — ABNORMAL LOW (ref 8.5–10.1)
Chloride: 116 mmol/L — ABNORMAL HIGH (ref 98–107)
Co2: 23 mmol/L (ref 21–32)
Creatinine: 5.73 mg/dL — ABNORMAL HIGH (ref 0.60–1.30)
EGFR (African American): 12 — ABNORMAL LOW
EGFR (Non-African Amer.): 10 — ABNORMAL LOW
Glucose: 104 mg/dL — ABNORMAL HIGH (ref 65–99)
OSMOLALITY: 312 (ref 275–301)
Potassium: 4.6 mmol/L (ref 3.5–5.1)
SGOT(AST): 111 U/L — ABNORMAL HIGH (ref 15–37)
Sodium: 147 mmol/L — ABNORMAL HIGH (ref 136–145)
Total Protein: 5.6 g/dL — ABNORMAL LOW (ref 6.4–8.2)

## 2014-02-12 LAB — CBC WITH DIFFERENTIAL/PLATELET
BASOS ABS: 0 10*3/uL (ref 0.0–0.1)
Basophil %: 0.1 %
EOS ABS: 0 10*3/uL (ref 0.0–0.7)
EOS PCT: 0.3 %
HCT: 32.1 % — ABNORMAL LOW (ref 40.0–52.0)
HGB: 10.2 g/dL — ABNORMAL LOW (ref 13.0–18.0)
LYMPHS PCT: 6.2 %
Lymphocyte #: 0.5 10*3/uL — ABNORMAL LOW (ref 1.0–3.6)
MCH: 29.2 pg (ref 26.0–34.0)
MCHC: 31.9 g/dL — ABNORMAL LOW (ref 32.0–36.0)
MCV: 92 fL (ref 80–100)
Monocyte #: 0.9 x10 3/mm (ref 0.2–1.0)
Monocyte %: 12.3 %
NEUTROS ABS: 5.9 10*3/uL (ref 1.4–6.5)
NEUTROS PCT: 81.1 %
PLATELETS: 145 10*3/uL — AB (ref 150–440)
RBC: 3.5 10*6/uL — AB (ref 4.40–5.90)
RDW: 14.5 % (ref 11.5–14.5)
WBC: 7.3 10*3/uL (ref 3.8–10.6)

## 2014-02-12 LAB — URINE CULTURE

## 2014-02-12 LAB — CLOSTRIDIUM DIFFICILE(ARMC)

## 2014-02-13 LAB — BASIC METABOLIC PANEL
Anion Gap: 8 (ref 7–16)
BUN: 44 mg/dL — ABNORMAL HIGH (ref 7–18)
CALCIUM: 7.6 mg/dL — AB (ref 8.5–10.1)
CO2: 23 mmol/L (ref 21–32)
Chloride: 116 mmol/L — ABNORMAL HIGH (ref 98–107)
Creatinine: 2.9 mg/dL — ABNORMAL HIGH (ref 0.60–1.30)
EGFR (Non-African Amer.): 22 — ABNORMAL LOW
GFR CALC AF AMER: 27 — AB
Glucose: 123 mg/dL — ABNORMAL HIGH (ref 65–99)
Osmolality: 305 (ref 275–301)
Potassium: 4.2 mmol/L (ref 3.5–5.1)
SODIUM: 147 mmol/L — AB (ref 136–145)

## 2014-02-14 LAB — CBC WITH DIFFERENTIAL/PLATELET
BASOS ABS: 0 10*3/uL (ref 0.0–0.1)
Basophil %: 0.1 %
Eosinophil #: 0.1 10*3/uL (ref 0.0–0.7)
Eosinophil %: 1.1 %
HCT: 30.6 % — ABNORMAL LOW (ref 40.0–52.0)
HGB: 9.9 g/dL — ABNORMAL LOW (ref 13.0–18.0)
LYMPHS ABS: 0.5 10*3/uL — AB (ref 1.0–3.6)
Lymphocyte %: 8.4 %
MCH: 29.4 pg (ref 26.0–34.0)
MCHC: 32.5 g/dL (ref 32.0–36.0)
MCV: 91 fL (ref 80–100)
MONOS PCT: 12.8 %
Monocyte #: 0.8 x10 3/mm (ref 0.2–1.0)
NEUTROS PCT: 77.6 %
Neutrophil #: 4.7 10*3/uL (ref 1.4–6.5)
PLATELETS: 125 10*3/uL — AB (ref 150–440)
RBC: 3.38 10*6/uL — AB (ref 4.40–5.90)
RDW: 14 % (ref 11.5–14.5)
WBC: 6 10*3/uL (ref 3.8–10.6)

## 2014-02-14 LAB — COMPREHENSIVE METABOLIC PANEL
Albumin: 1.8 g/dL — ABNORMAL LOW (ref 3.4–5.0)
Alkaline Phosphatase: 243 U/L — ABNORMAL HIGH
Anion Gap: 9 (ref 7–16)
BUN: 30 mg/dL — ABNORMAL HIGH (ref 7–18)
Bilirubin,Total: 0.4 mg/dL (ref 0.2–1.0)
CHLORIDE: 113 mmol/L — AB (ref 98–107)
CO2: 22 mmol/L (ref 21–32)
CREATININE: 2.13 mg/dL — AB (ref 0.60–1.30)
Calcium, Total: 7.2 mg/dL — ABNORMAL LOW (ref 8.5–10.1)
EGFR (African American): 38 — ABNORMAL LOW
GFR CALC NON AF AMER: 32 — AB
Glucose: 120 mg/dL — ABNORMAL HIGH (ref 65–99)
OSMOLALITY: 294 (ref 275–301)
Potassium: 3.8 mmol/L (ref 3.5–5.1)
SGOT(AST): 133 U/L — ABNORMAL HIGH (ref 15–37)
SGPT (ALT): 95 U/L — ABNORMAL HIGH
SODIUM: 144 mmol/L (ref 136–145)
TOTAL PROTEIN: 4.9 g/dL — AB (ref 6.4–8.2)

## 2014-02-15 LAB — BASIC METABOLIC PANEL
Anion Gap: 7 (ref 7–16)
BUN: 30 mg/dL — AB (ref 7–18)
CO2: 23 mmol/L (ref 21–32)
Calcium, Total: 7.8 mg/dL — ABNORMAL LOW (ref 8.5–10.1)
Chloride: 113 mmol/L — ABNORMAL HIGH (ref 98–107)
Creatinine: 2.17 mg/dL — ABNORMAL HIGH (ref 0.60–1.30)
EGFR (African American): 37 — ABNORMAL LOW
EGFR (Non-African Amer.): 31 — ABNORMAL LOW
Glucose: 147 mg/dL — ABNORMAL HIGH (ref 65–99)
Osmolality: 294 (ref 275–301)
Potassium: 4 mmol/L (ref 3.5–5.1)
Sodium: 143 mmol/L (ref 136–145)

## 2014-02-22 ENCOUNTER — Inpatient Hospital Stay: Payer: Self-pay | Admitting: Internal Medicine

## 2014-02-22 LAB — COMPREHENSIVE METABOLIC PANEL
ALBUMIN: 1.7 g/dL — AB (ref 3.4–5.0)
ANION GAP: 13 (ref 7–16)
AST: 173 U/L — AB (ref 15–37)
Alkaline Phosphatase: 309 U/L — ABNORMAL HIGH
BUN: 78 mg/dL — ABNORMAL HIGH (ref 7–18)
Bilirubin,Total: 1.1 mg/dL — ABNORMAL HIGH (ref 0.2–1.0)
CALCIUM: 7.9 mg/dL — AB (ref 8.5–10.1)
Chloride: 116 mmol/L — ABNORMAL HIGH (ref 98–107)
Co2: 22 mmol/L (ref 21–32)
Creatinine: 2.5 mg/dL — ABNORMAL HIGH (ref 0.60–1.30)
EGFR (African American): 32 — ABNORMAL LOW
GFR CALC NON AF AMER: 26 — AB
GLUCOSE: 147 mg/dL — AB (ref 65–99)
Osmolality: 326 (ref 275–301)
POTASSIUM: 5.2 mmol/L — AB (ref 3.5–5.1)
SGPT (ALT): 119 U/L — ABNORMAL HIGH
Sodium: 151 mmol/L — ABNORMAL HIGH (ref 136–145)
Total Protein: 5.6 g/dL — ABNORMAL LOW (ref 6.4–8.2)

## 2014-02-22 LAB — URINALYSIS, COMPLETE
Bacteria: NONE SEEN
Bilirubin,UR: NEGATIVE
Glucose,UR: NEGATIVE mg/dL (ref 0–75)
Ketone: NEGATIVE
LEUKOCYTE ESTERASE: NEGATIVE
Nitrite: NEGATIVE
PH: 5 (ref 4.5–8.0)
RBC,UR: 30 /HPF (ref 0–5)
Specific Gravity: 1.02 (ref 1.003–1.030)
Squamous Epithelial: NONE SEEN
WBC UR: 4 /HPF (ref 0–5)

## 2014-02-22 LAB — CBC
HCT: 40.5 % (ref 40.0–52.0)
HGB: 12.7 g/dL — AB (ref 13.0–18.0)
MCH: 28.1 pg (ref 26.0–34.0)
MCHC: 31.5 g/dL — ABNORMAL LOW (ref 32.0–36.0)
MCV: 89 fL (ref 80–100)
Platelet: 262 10*3/uL (ref 150–440)
RBC: 4.54 10*6/uL (ref 4.40–5.90)
RDW: 15.5 % — AB (ref 11.5–14.5)
WBC: 15.4 10*3/uL — ABNORMAL HIGH (ref 3.8–10.6)

## 2014-02-22 LAB — PRO B NATRIURETIC PEPTIDE: B-TYPE NATIURETIC PEPTID: 2188 pg/mL — AB (ref 0–450)

## 2014-02-22 LAB — TROPONIN I: Troponin-I: 0.03 ng/mL

## 2014-02-24 LAB — BASIC METABOLIC PANEL
Anion Gap: 10 (ref 7–16)
BUN: 89 mg/dL — ABNORMAL HIGH (ref 7–18)
Calcium, Total: 8 mg/dL — ABNORMAL LOW (ref 8.5–10.1)
Chloride: 119 mmol/L — ABNORMAL HIGH (ref 98–107)
Co2: 22 mmol/L (ref 21–32)
Glucose: 150 mg/dL — ABNORMAL HIGH (ref 65–99)
Osmolality: 330 (ref 275–301)
Potassium: 5.2 mmol/L — ABNORMAL HIGH (ref 3.5–5.1)
Sodium: 151 mmol/L — ABNORMAL HIGH (ref 136–145)

## 2014-02-24 LAB — CREATININE, SERUM
Creatinine: 3.02 mg/dL — ABNORMAL HIGH (ref 0.60–1.30)
EGFR (African American): 26 — ABNORMAL LOW
EGFR (Non-African Amer.): 21 — ABNORMAL LOW

## 2014-02-24 LAB — URINE CULTURE

## 2014-02-27 LAB — CULTURE, BLOOD (SINGLE)

## 2014-03-01 ENCOUNTER — Ambulatory Visit: Payer: Self-pay | Admitting: Oncology

## 2014-03-01 ENCOUNTER — Ambulatory Visit: Payer: Self-pay | Admitting: Radiation Oncology

## 2014-03-01 DEATH — deceased

## 2014-05-22 NOTE — Op Note (Signed)
PATIENT NAME:  [name] [name], [name] [name] MR#:  [id number] DATE OF BIRTH:  1926/02/16  DATE OF PROCEDURE:  07/01/2013  PREOPERATIVE DIAGNOSES:  1.  Gross hematuria.  2.  Right hydronephrosis/hydroureter.  3.  Bladder tumor.   POSTOPERATIVE DIAGNOSES:   1.  Gross hematuria.  2.  Right hydronephrosis/hydroureter.  3.  Bladder tumor.   PROCEDURES PERFORMED: 1.  Transurethral resection of bladder tumor.  2.  Placement of right ureteral stent.   SURGEON: Irineo Axon, M.D.   ASSISTANT: None.   ANESTHESIA: General.   INDICATIONS: An 81 year old male who on a routine abdominal aortic aneurysm followup was noted to have moderate to severe right hydronephrosis and hydroureter and bladder wall thickening. He was scheduled for a CT and in the interim developed gross hematuria. His creatinine was 2.5 and could not receive IV contrast. A noncontrast CT of the abdomen and pelvis showed no evidence of stone and hydronephrosis and hydroureter with thickening of the right bladder wall. Subsequent cystoscopy was remarkable for a nodular-appearing tumor on the right bladder. There was also a papillary tumor noted on the left posterior wall. The right ureteral orifice could not be identified. He presents for TURBT and an attempt at placement of a right ureteral stent.   DESCRIPTION OF PROCEDURE:  He was taken to the cystoscopy suite and placed on the table in the supine position. A general anesthetic was administered via an LMA. He was then placed in the low lithotomy position and his external genitalia were prepped and draped in the usual fashion. A timeout was performed per protocol with all in agreement. A 27 French continuous flow resectoscope sheath with visual obturator was lubricated and passed up per urethra without difficulty. Prostate was remarkable for mild lateral lobe enlargement. No significant bladder neck elevation or median lobe. Bladder mucosa was closely inspected. On the right lateral wall and hemitrigone  was an approximately 2 cm nodular-appearing bladder tumor and surrounding this tumor was an erythematous mucosa with bullous edema. A right ureteral orifice could not be identified. On the left posterior wall was an approximately 1 cm papillary tumor. The left ureteral orifice was normal-appearing with clear efflux. The papillary tumor was resected down to superficial muscle with the [facility] resectoscope. Resection site was cauterized and specimen was removed with irrigation. Attention was then directed to the right bladder wall where the nodular tumor and surrounding bullous edema was resected down to superficial muscle. In the expected location of the right ureteral orifice, this erythematous mucosa was resected and the resected ureteral orifice was then easily identified. No efflux of urine was noted from the orifice. All specimen was removed with irrigation. A second resection of the base of the tumor was then sent. Using a resectoscope adapter, a 0.035 hydrophilic guidewire was placed through the resectoscope and into the right ureteral orifice. The wire was passed up to the right renal pelvis. A 5 French open-ended ureteral catheter was then placed over the wire and the wire was removed. Retrograde pyelogram was performed, which showed moderate hydronephrosis and hydroureter. No narrowing or filling defect was seen along the course of the ureter. Guidewire was replaced and the ureteral catheter was removed. A 6 French/24 cm Contour ureteral stent was then placed over the wire. There was good curl seen in the renal pelvis on fluoroscopy and the distal end of the stent was well positioned in the bladder. The Iglesias resectoscope was replaced into the sheath. The resection site and surrounding mucosa was then fulgurated with a button  electrode. The left posterior wall resection site was fulgurated with a button electrode. A 20 French Foley catheter was placed with return of clear effluent upon irrigation. A B  and O suppository was placed per rectum. He was taken to the PACU in stable condition. There were no complications. EBL minimal.      ____________________________ Verna CzechScott C. Lonna CobbStoioff, MD scs:dmm D: 07/02/2013 11:04:19 ET T: 07/02/2013 11:41:39 ET JOB#: [id number]  cc: Lorin PicketScott C. Lonna CobbStoioff, MD, <Dictator> Riki AltesSCOTT C STOIOFF MD ELECTRONICALLY SIGNED 07/08/2013 15:19

## 2014-05-22 NOTE — Consult Note (Signed)
Reason for Visit: This 81 year old Male patient presents to the clinic for initial evaluation of  bladder cancer .   Referred by Dr. Lonna [name].  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 He developed some gross hematuria and not cracked contrast CT scan again showing thickening the right bladder wall and associated hydronephrosis and hydroureter. He was taken to cystoscopy and noted to have a nodular-appearing tumor in the right lateral wall as well as papillary tumor noted on the left posterior wall. He underwent a TURBT. Biopsies were positive for high grade transitional cell carcinoma with invasion of the muscularis propria. Alsoright ureteral stent was placed.patient tolerated treatment well as had some persistent urinary symptoms such as frequency urgency and nocturia and some minor hematuria. [name] [name] the patient's significant comorbidities including congestive heart failure abdominal aortic aneurysm he is now referred for gynecology for consideration of treatment. Has been recently put on a trial oftrosplum for his urinary symptoms.  Past Hx:    renal failure:    chf:    Hyperlipidemia:    MRSA within past 6 months:    MI - Myocardial Infarct:    CVA/Stroke:    GI Bleed, Lower:    HTN:    cardiac stent:    pacemaker:    Cardiac Cath:    Hernia Repair:   Past, Family and Social History:  Past Medical History positive   Cardiovascular  congestive heart failure; coronary artery stents; hyperlipidemia; hypertension; myocardial infarction; pacemaker   Genitourinary lower GI bleed   Neurological/Psychiatric CVA   Past Surgical History herniorrhaphy repair   Past Medical History Comments MRSA past 6 months   Family History positive   Family History Comments mother with adult onset diabetes father and mother both with MIs   Social History positive   Social History Comments 40+ pack smoking history has quit smoking several years prior no EtOH abuse history   Additional Past Medical and Surgical History accompanied by multiple family members today   Allergies:   No Known Allergies:   Home Meds:  Home Medications: Medication Instructions Status  acetaminophen-HYDROcodone 325 mg-5 mg oral tablet 1 tab(s) orally every 6 to 8 hours, As Needed, pain , As needed, pain Active  lisinopril 5 mg oral tablet 1 tab(s) orally once a day Active  alprazolam 0.25 mg oral tablet 1 tab(s) orally every 8 hours as needed   Active  furosemide 40 mg oral tablet 1 tab(s) orally once a day  Active  spironolactone 25 mg oral tablet 1 tab(s) orally once a day  Active  terazosin capsule 2 mg 1 cap(s) orally once a day (at bedtime)  Active  Zocor 40 mg oral tablet 1 tab(s) orally once a day (at bedtime) Active  metroNIDAZOLE 250 mg oral tablet 1 tab(s) orally , As Needed for diverticulitis is acting up Active  aspirin 81 mg oral tablet 1.5 tab(s) orally once a day Active  Bystolic 5 mg oral tablet 1 tab(s) orally once a day Active   Review of  Systems:  General negative   Performance Status (ECOG) 0   Skin negative   Breast negative   Ophthalmologic negative   ENMT negative   Respiratory and Thorax negative   Cardiovascular see HPI   Gastrointestinal negative   Genitourinary see HPI   Musculoskeletal negative   Neurological negative   Psychiatric negative   Hematology/Lymphatics negative   Endocrine negative    Allergic/Immunologic negative   Review of Systems   review of systems obtained from nurses notes  Nursing Notes:  Nursing Vital Signs and Chemo Nursing Nursing Notes: *CC Vital Signs Flowsheet:   25-Jun-15 11:04  Temp Temperature 94.4  Pulse Pulse 59  Respirations Respirations 20  SBP SBP 149  DBP DBP 77  Pain Scale (0-10)  0  Current Weight (kg) (kg) 72.4   Physical Exam:  General/Skin/HEENT:  General normal   Skin normal   Eyes normal   ENMT normal   Head and Neck normal   Additional PE well-developed elderly male in NAD. Lungs are clear to A&P cardiac examination shows regular rate and rhythm. Abdomen is benign with no organomegaly or masses noted. No suprapubic tenderness is identified. No inguinal adenopathy is noted.   Breasts/Resp/CV/GI/GU:  Respiratory and Thorax normal   Cardiovascular normal   Gastrointestinal normal   Genitourinary normal   MS/Neuro/Psych/Lymph:  Musculoskeletal normal   Neurological normal   Lymphatics normal   Other Results:  Radiology Results: US:    27-Mar-14 10:21, US Aorta  US Aorta   REASON FOR EXAM:    Known AAA  COMMENTS:       PROCEDURE: US  - US AORTA  - Apr 24 2012 10:21AM     RESULT: Abdominal ultrasound evaluating the abdominal aorta shows mild   aneurysmal dilation in the infrarenal distal abdominal aorta showing an   anterior to posterior dimension of 3.35 cm and a transverse dimension of   3.4 cm. The iliac vessels appear to be normal in caliber. The mid   abdominal aorta is 2.13 x 2.24 cm. In comparison to the previous   examination there is no significant intervalchange. A previous   ultrasound was performed on 13 February 2011.    IMPRESSION:  Stable distal abdominal aortic aneurysm.  Dictation Site: 2        Verified By: Elveria RoyalsGEOFFREY H. [name], M.D., MD  LabUnknown:  PACS Image   CT:    16-Jan-13 12:49, CT Pelvis Without Contrast  CT Pelvis Without Contrast   REASON FOR EXAM:    abdominal  mass  COMMENTS:       PROCEDURE: CT  - CT PELVIS STANDARD WO  - Feb 14 2011 12:49PM     RESULT: History: Abdominal mass    Comparison: None    Technique: Multiple axial images obtained of the pelvis with oral   contrast only.    Findings:    There is diverticulosis without evidence of diverticulitis. There is a     6.3 cm hypodense, fluid attenuating left inferior pole renal mass most   consistent with a cyst. The bladder is partially decompressed. There is   no evidence of a mass adjacent to the bladder as seen on prior ultrasound.    IMPRESSION:     There is no evidence of a mass adjacent to the bladder as seen on prior   ultrasound. This may have represented a fluid-filled bowel loop adjacent   to the bladder on the ultrasound.          Verified  By: Joellyn Haff, M.D., MD   Relevent Results:   Relevant Scans and Labs CT scans and ultrasound are reviewed.   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 I have set up an appointment for evaluation with medical oncology. I also believe patient will benefit from radiation therapy. I would treat his pelvis to 4500 cGy over 4-5 weeks boosting his bladder and area involved in another 2000 cGy using three-dimensional treatment planning. Risks and benefits of treatment including increased urinary frequency urgency nocturia, diarrhea, fatigue, alteration blood counts, all were explained in detail to the patient and his family. I set up consultation with medical oncology as well as asked him to return in about a week's time for CT simulation.  I would like to take this opportunity  for allowing me to participate in the care of your patient..  CC Referral:  cc: Dr. Lonna [name], Dr. Clydie Braun   Electronic Signatures: Rushie Chestnut, Gordy Councilman (MD)  (Signed 25-Jun-15 12:31)  Authored: HPI, Diagnosis, Past Hx, PFSH, Allergies, Home Meds, ROS, Nursing Notes, Physical Exam, Other Results, Relevent Results, Encounter Assessment and Plan, CC Referring Physician   Last Updated: 25-Jun-15 12:31 by Rebeca Alert (MD)

## 2014-05-23 NOTE — Consult Note (Signed)
Brief Consult Note: Diagnosis: Allergic Rhinitis.   Consult note dictated.   Discussed with Attending MD.   Comments: Patient admitted for TIA like symptoms.  Underwent CT scan which showed mucosal thickening of sphenoid sinuses.  Right sphenoid sinus very small and was called "near total opacification" on CT scan.  Patient denies any purulent drainage, facial pain, pressure, congestion, or rhinorrhea.  PE Nose- clear anteriorly with no mucopus or polyps OC/OP- clear with no posterior drainage Ears- complete cerumen impaction bilaterally  Impression:  Mucosal thickening of sinuses with no symptoms of sinusitis Plan:  Discussed with patient and family.  No relation to neurologic symptoms.  Rec. addition of flonase QDay and follow up as outpatient for ear evaluation and repeat eval of sinuses at that time.  No role for antibiotics currently.  Electronic Signatures: Halle Davlin, Rayfield Citizenreighton Charles (MD)  (Signed 15-Jan-13 12:41)  Authored: Brief Consult Note   Last Updated: 15-Jan-13 12:41 by Flossie DibbleVaught, Dellar Traber Charles (MD)

## 2014-05-23 NOTE — Consult Note (Signed)
PATIENT NAME:  [name] [name], Reynaldo MR#:  [id number] DATE OF BIRTH:  February 10, 1926  DATE OF CONSULTATION:  02/13/2011  REFERRING PHYSICIAN:   CONSULTING PHYSICIAN:  Kyung Ruddreighton C. Cassiopeia Florentino, MD  CHIEF COMPLAINT: Abnormal CT scan.   HISTORY OF PRESENT ILLNESS: Patient is a 81 year old male who is admitted from the ED on 02/12/2011 for right upper extremity weakness. He has a history of coronary artery disease and has had problems with transient ischemic attacks in the past. He had a previous evaluation of problem about four years ago. He has a pacemaker so was unable to undergo an MRI scan. He underwent a CT scan which showed some mucosal thickening of his left sphenoid sinus with "near complete opacification" of his right sphenoid sinus. Patient denies any nasal congestion, purulent drainage, headache, facial pain, pressure, fever or other associated signs and symptoms. He reports his allergies are usually well controlled.    PAST MEDICAL HISTORY:  1. Coronary artery disease. 2. Cardiomyopathy. 3. Congestive heart failure. 4. Complete heart block.  5. Hypertension.  6. Hyperlipidemia.  7. Paroxysmal atrial fibrillation. 8. Diabetes mellitus. 9. Stroke. 10. Benign prostatic hypertrophy. 11. Depression.   PAST SURGICAL HISTORY:  1. CABG. 2. Pacemaker implant. 3. Hernia repair.   ALLERGIES: No known drug allergies.   CURRENT MEDICATIONS: Reviewed and documented in electronic medical record.   SOCIAL HISTORY: Patient is married. He is ambulatory. Denies any tobacco or alcohol use.   FAMILY HISTORY: Significant for coronary artery disease.   PHYSICAL EXAMINATION:  GENERAL: He is a well-nourished male, alert, no acute distress.   EARS: Reveals near complete opacification with cerumen impaction.   NOSE: Clear anteriorly with no mucopus or polyps.  ORAL CAVITY/OROPHARYNX: No masses or lesions posterior nasopharynx. Oropharynx is clear with no drainage from the posterior nasopharynx.   NECK: Clear  with no lymphadenopathy or thyromegaly.   LABORATORY, DIAGNOSTIC AND RADIOLOGICAL DATA: CT scan is reviewed which reveals widely open maxillary ethmoid and frontal sinuses. He does have some mucosal thickening of his left sphenoid sinus. His right sphenoid sinus is very small and I believe the called near total opacification is actually just mucosal thickening of the lining just given its size. Remaining CT is normal.   IMPRESSION: Mucosal thickening on CT scan with no current symptoms of sinusitis or allergic rhinitis.   PLAN: I discussed my findings with the patient and his family. I do not think that the sphenoid mucosal thickening has any relationship to his right upper extremity arm weakness. I have recommended at this time treatment with Flonase and just observation. He does have a history of cerumen impaction and does have that on my examination. I recommend follow up as an outpatient for repeat evaluation of his sinuses as well as removal of the wax in his ears.   ____________________________ Kyung Ruddreighton C. Quinten Allerton, MD ccv:cms D: 02/13/2011 12:44:47 ET T: 02/13/2011 13:36:35 ET JOB#: [id number] cc: Kyung Ruddreighton C. Harshika Mago, MD, <Dictator> Kyung RuddREIGHTON C Toryn Dewalt MD ELECTRONICALLY SIGNED 02/13/2011 17:54

## 2014-05-23 NOTE — Consult Note (Signed)
Asked to see patient regarding left carotid stenosis and right arm weakness.  The weakness started 2 days ago.  It has largely resolved, but he still feels poor coordination and control of the arm.  He had a carotid duplex suggesting 50-75% stenosis on the left and [facility] that he had a 50% left carotid stenosis.  He has CKD and cant get a CT angiogram as we would normally like.  His pacer/AICD precludes MRA.  Would recommend carotid angiogram for further evaluation to limit contrast and be more precise in determining the degree of stenosis given his symptomatic state.  He and his wife would like to discuss this, and I would be happy to get this on the schedule tomorrow or Friday if they desire.    Electronic Signatures: Annice Needyew, Naijah Lacek S (MD)  (Signed on 16-Jan-13 11:18)  Authored  Last Updated: 16-Jan-13 11:18 by Annice Needyew, Ilyse Tremain S (MD)

## 2014-05-23 NOTE — Discharge Summary (Signed)
PATIENT NAME:  [name] [name], [name] [name] MR#:  [id number] DATE OF BIRTH:  Mar 10, 1926  DATE OF ADMISSION:  02/14/2011 DATE OF DISCHARGE:  02/14/2011  HISTORY OF PRESENT ILLNESS:  [name] [name] is an 81 year old man with a known history of ischemic disease, pacemaker for heart block-dual-chamber, with known diabetic cardiomyopathy and previous congestive heart failure, hypertension, hyperlipidemia, peripheral left disease, diabetes, history of paroxysmal atrial fibrillation, and a previous stroke syndrome. Last neurological episode was about four years ago, and he has had known partial abnormal carotid blockage on an ultrasound. He has been followed at [facility] by their vascular surgeon as well. He presented to the Emergency Room with at least 12 hours or more of a weak right upper arm with floppy hand. He was asymptomatic. His CAT scan was negative in the Emergency Room. His renal function was a stage III, was really basically without significant change with a creatinine 2.39 and a BUN of 39, which was slightly worse than his previous baseline creatinine of 1.9 and a BUN of 39. This precluded an angiogram, and he was unable to have an MRI due to his dual-chamber pacemaker. The patient clinically improved dramatically over the next 12 to 24 hours. He remained alert, articulate.   HOSPITAL COURSE On admission, his diuretics and his beta blockers were held. His renal function did improve. He remained cardiovascularly stable. The carotid ultrasound suggested possibly a more significant obstruction involving the left carotid with a 50 to 67% being ascertained. He was seen in consultation by on of our Vascular Interventionists,  Dr. Lucky Cowboy, who recommended a carotid arteriogram with minimal dye and possible intervention. Because the patient was stable, the  patient felt more comfortable to contact his [facility] vascular doctor and to be seen there for further treatment and possible intervention. He really wanted to go home and make these  arrangements himself.   LABORATORY, DIAGNOSTIC AND RADIOLOGICAL DATA:  Last laboratory studies on 02/14/2011: Glucose was 96, BUN 30, creatinine 1.61, sodium 146, potassium 4.3, chloride 110, eGFR 54, calcium 8.4. White count 5400, hemoglobin 10.8.  Carotid echocardiogram was done during the hospitalization and showed normal LV function of 50%, mild mitral regurgitation, some mild tricuspid regurgitation, no intravascular clots were seen.  EKG showed pacemaker functioning properly, on telemetry remained in sync as well.   PA and lateral chest showed no acute pulmonary disease. The heart and mediastinum was normal.  During the ultrasound evaluation for his known AAA, which we have been following as an outpatient, a possible cyst was identified, and a CAT scan of the pelvis with contrast found no significant pathology.  Ultrasound of the aorta: The proximal aorta measured 2.7 x 2.5 cm. The mid abdominal aorta measured 2.2 x 2.2 cm. The distal abdominal aorta measured 3.4 x 3.3 cm. Previous scans appear to be unchanged.  Carotid Doppler: On visual inspection, there appeared to be a 50 to 75% stenosis of the left proximal internal carotid artery and carotid bulb; however, the peak systolic velocities were not elevated. There was no evidence of hemodynamically significant stenosis on the right. The vertebral arteries were patent and demonstrated antegrade flow.  He did have ultrasound of the kidneys. The right kidney measured 10.1 x 4.7 x 4.5 cm. The left kidney measured 10.2 x 5.1 x 4.5 cm with no hydronephrosis. Multiple cysts bilaterally of the kidneys were noted. The largest in the right kidney was 5.0 centimeters, the largest in the left kidney was 5.3 cm.   VITAL SIGNS: The patient's blood pressure ranged  from about 131/86 to 145/72. Pulses remained in the 60s. He was afebrile. His oximetry measured normal on room air at greater than 98%.   The patient was seen by Physical Therapy. He was seen by  Occupational Therapy. The patient was discharged on his home medications except his aspirin 81 mg was doubled in dose.   DISCHARGE MEDICATIONS:  1. Alprazolam 0.25 mg every 8 hours p.r.n. for his known anxiety.  2. Lisinopril 20 mg daily, l tablet b.i.d.   3. Furosemide 40 mg daily.  4. Spironolactone 25 mg daily.  5. Terazosin 2 mg daily.  6. Aspirin 81 mg b.i.d.   7. Zocor 40 mg daily.  8. Bystolic 10 mg daily. 9. Metronidazole 250 mg p.r.n.   DISCHARGE DIAGNOSES:  1. Acute stroke syndrome, improved. 2. Known carotid artery disease.  3. Ischemic cardiomyopathy.  4. Heart block with pacemaker.  5. Diabetes, in remission with dietary control.  6. Hypertensive cardiovascular disease.  7. Anxiety/depressive syndrome.   PROGNOSIS: His overall prognosis remains guarded.  ____________________________ Dianah Field Mable Fill, MD dcc:cbb D: 02/15/2011 07:38:42 ET T: 02/15/2011 07:52:45 ET JOB#: [id number]  cc: Quantisha Marsicano C. Mable Fill, MD, <Dictator> Robson Trickey Sadie Haber MD ELECTRONICALLY SIGNED 03/07/2011 7:26

## 2014-05-23 NOTE — H&P (Signed)
PATIENT NAME:  [name] [name], [name] [name] MR#:  [id number] DATE OF BIRTH:  Jun 06, 1926  DATE OF ADMISSION:  02/12/2011  CHIEF COMPLAINT: "My right arm was floppy after I woke up." History was also obtained from the family members.   HISTORY OF PRESENT ILLNESS: 81 year old male who has history of coronary artery disease with previous coronary artery bypass graft. He has history of complete heart block status post dual-chamber pacemaker placement, also history of dilated cardiomyopathy with congestive heart failure, hypertension, hyperlipidemia, peripheral vascular disease, diabetes, paroxysmal atrial fibrillation and previous history of cerebrovascular accident. The last episode was about four years ago as per the family. Today after he woke up and he noted that his weakness of the right upper extremity. What he described was that the right upper extremity was floppy. As per the wife he could not hold anything in the right hand at that time. As per the family members whenever he has mini strokes or stokes it usually affects his right side. Symptoms have improved in the last one hour but he still has some weakness and coordination problems with his right upper extremity. He denies any tingling or numbness. He denies any problem. No involvement of his right lower extremity. He denies any twisting of his face, vision changes or difficulty swallowing. He denies any headache. He had a CT head done that was negative. He also was found to have some worsening of renal function in the Emergency Room with a BUN of 39 and creatinine of 2.39 and sodium of 147. His last renal function at [facility] was on 01/04/2011 which showed that the patient had sodium of 143 at that time, a creatinine of 1.9 and BUN of 39. He denies any chest pain or shortness of breath at this time. No palpitations. He is getting 324 mg of aspirin in the Emergency Room. He is being admitted for transient ischemic attack also with acute on chronic renal failure,  chronic kidney disease stage III.   REVIEW OF SYSTEMS: He denies any fever or weakness. No weight gain. No acute change in vision. No headache. No dizziness. No cough. No dyspnea. No chest pain. No dyspnea on exertion. No nausea, vomiting, abdominal pain, gastrointestinal bleed. No dysuria. No frequency. No thyroid problems. No rash. No joint pains. No swelling. He is complaining of some weakness of the right upper extremity. Denies any tingling or numbness, any dysarthria, any difficulty swallowing. No focal numbness. No anxiety. depression.   PAST MEDICAL HISTORY:  1. Coronary artery disease with previous coronary artery bypass graft in 2009.  2. Dilated cardiomyopathy with congestive heart failure, ejection fraction of around 20%.  3. Complete heart block status post dual-chamber pacemaker placement in 2009.  4. Hypertension.  5. Hyperlipidemia.  6. Diabetes mellitus, not on any medication.  7. Paroxysmal atrial fibrillation. 8. Peripheral vascular disease. 9. History of CVA in 2009.  10. Benign prostatic hypertrophy. 11. Depression.   PAST SURGICAL HISTORY:  1. Coronary artery bypass graft.  2. Dual-chamber pacemaker placement. 3. Right hernia repair.   ALLERGIES TO MEDICATIONS: None.   HOME MEDICATIONS:  1. Aspirin 81 mg every other day. 2. Alprazolam 0.25 mg q.8 hours as needed. He says he only takes once a day. 3. Bystolic 10 mg daily.  4. Lasix 40 mg daily.  5. Lisinopril 20 mg b.i.d.  6. Metronidazole as needed. 7. Spironolactone 25 mg daily. 8. Terazosin 2 mg at bedtime. 9. Zocor 40 mg at bedtime.   SOCIAL HISTORY: No smoking or alcohol use.  He is ambulatory. Does not use any walker or cane.   FAMILY HISTORY: His father had history of heart disease.   PHYSICAL EXAMINATION:  VITAL SIGNS: His vitals when he presented to the Emergency Room include: Temperature 97.6, heart rate 63, respiratory rate 20, blood pressure 165/72, saturating 96% on room air.   GENERAL: This  is an elderly Caucasian male comfortably sitting in bed, no acute distress.   HEENT: Bilateral pupils are equal. Extraocular muscles are intact. No scleral icterus. No conjunctivitis. Oral mucosa is moist. No pallor.   NECK: No thyroid tenderness, enlargement or nodule. Neck is supple. No masses, nontender. No adenopathy. No JVD. No carotid bruit.   CHEST: Bilateral breath sounds are clear. No wheeze. Normal effort. No respiratory distress.   HEART: Heart sounds are regular. No murmur. Peripheral pulses 1+. No edema.   ABDOMEN: Soft, nontender. Normal bowel sounds. No hepatosplenomegaly. No bruits. No masses.   RECTAL: Deferred.   NEUROLOGIC: He is awake, alert, oriented to time, place, and person. Speech is fluent. Comprehension is intact. Cranial nerves III, IV, VI, VII normal. There is slight tongue deviation to the right side. He has slight right pronator drift but he has positive finger-to-nose test on the right side. Power is greater than 4/5 in bilateral lower extremity.   EXTREMITIES: No cyanosis. No clubbing.   SKIN: No rash. No lesions.   LABORATORY, DIAGNOSTIC AND RADIOLOGICAL DATA: White count 6.1, hemoglobin 12.4, platelet count 153,000. BMP: Sodium 147, potassium 4.5, BUN 39, creatinine 2.31, glucose 115. LFTs are normal. eGFR 29 at this time. His last renal function showed creatinine of 1.9 with a BUN of 39 with an eGFR of around 34 so he had chronic kidney disease stage III. He had a lipid profile done in December 2012 which shows LDL of 75.8. His CT of the head is essentially negative. His EKG shows atrial and ventricular paced rhythm, unchanged from prior EKGs.   IMPRESSION:  1. Transient ischemic attack.  2. Coronary artery disease with previous coronary artery bypass graft. 3. Chronic systolic failure compensated at this time.  4. Hypertension.  5. Acute on chronic renal failure, chronic kidney disease stage III. 6. Status post dual chamber pacemaker placement.   7. Hyperlipidemia.  8. Diabetes mellitus, not on medications. 9. Previous history of cerebrovascular accident, transient ischemic attack in 2009.   PLAN: An 81 year old male with multiple risk factors. He has coronary artery disease, previous coronary artery bypass graft, previous history of cerebrovascular accident, diabetes, hypertension, hyperlipidemia, peripheral vascular disease and chronic systolic failure compensated at this time, chronic kidney disease stage III with baseline creatinine 1.9 in December. He comes in with right upper extremity weakness. He could not control it. He has a positive finger-to-nose test and that is improving since it started. Looks like a transient ischemic attack. Cannot do an MRI of the brain because he has a pacemaker. Will check an ultrasound carotid and echocardiogram. Will place him on neuro checks. He takes aspirin 81 mg every other day. I am going to place him on aspirin daily. He is apprehensive about updating the regimen to Plavix or Aggrenox. He said that he has some problems with bleeding, probably with Plavix in the past. I told him that he can have a conversation with Dr. Mable Fill about Aggrenox but right now I am going to give him aspirin daily. Also his kidney function is slightly worsened. I am going to just give him fluids at half-normal saline at 100 mL/h for five  hours and I am going to cut down his Lasix a little bit to 20 mg daily. His creatinine needs to be monitored. Possibly he can be referred to nephrology as outpatient. We will continue his statin, Bystolic and lisinopril for his coronary artery disease and hypertension. Continue spironolactone, and low dose Lasix for his congestive heart failure. Plan was discussed with the patient and the family in detail. Patient will be transferred to Dr. Dierdre Highman service. I will admit him to observation. I will put in occupational therapy consult for him because of his right upper extremity weakness.   TIME  SPENT WITH ADMISSION AND COORDINATION: 50 minutes    ____________________________ Mena Pauls, MD ag:cms D: 02/12/2011 21:18:44 ET T: 02/13/2011 07:04:41 ET JOB#: [id number]  cc: Mena Pauls, MD, <Dictator> Mena Pauls MD ELECTRONICALLY SIGNED 02/23/2011 10:58

## 2014-05-23 NOTE — Discharge Summary (Signed)
PATIENT NAME:  Assunta FoundSLEY, Lyal MR#:  [id number] DATE OF BIRTH:  01/05/1927  DATE OF ADMISSION:  02/14/2011 DATE OF DISCHARGE:  02/14/2011  ADDENDUM: His [facility] Medical Number is [id number].   The patient will be seen by Dr. Pricilla Holmucker.   ____________________________ Jimmie Mollyon C. Candelaria Stagershaplin, MD dcc:cbb D: 02/15/2011 07:41:02 ET T: 02/15/2011 07:47:03 ET JOB#: [id number]  cc: Don C. Candelaria Stagershaplin, MD, <Dictator> Wylene MenSonny W. Tucker, Jr., MD of  [facility], [facility], Division of Vascular Surgery Virl AxeN C CHAPLIN MD ELECTRONICALLY SIGNED 03/07/2011 7:26

## 2014-05-23 NOTE — Consult Note (Signed)
PATIENT NAME:  Assunta FoundSLEY, Cartel MR#:  [id number] DATE OF BIRTH:  08-10-1926  DATE OF CONSULTATION:  02/14/2011  REFERRING PHYSICIAN:  Conchita Parison Chaplin, MD CONSULTING PHYSICIAN:  Annice NeedyJason S. Japji Kok, MD  REASON FOR CONSULTATION: Carotid disease.   HISTORY OF PRESENT ILLNESS: This is an 81 year old male who presented two days prior with confusion and right arm weakness. His weakness has largely resolved. He still feels he has reasonably poor coordination and control of that arm. The patient was evaluated with a carotid duplex which suggested a 50 to 75% stenosis of the left carotid artery, and [facility] he had an approximately 50% carotid stenosis on the left. His  situation was complicated by the fact that he has chronic kidney disease and he did not get a CT angiogram as we would normally do. He also has a pacer/automatic implantable cardiac defibrillator which will preclude MR angiogram. We are asked to evaluate him further for his carotid disease. He has no visual symptoms and no speech or swallowing difficulties at current.   PAST MEDICAL HISTORY:  1. Coronary artery disease, status post coronary bypass grafting 2009.  2. Dilated cardiomyopathy and congestive heart failure with ejection fraction 20%.  3. Dual chamber pacemaker placement for complete heart block, in 2009. 4. Hypertension.  5. Hyperlipidemia.  6. Diabetes mellitus.  7. Atrial fibrillation.  8. Peripheral vascular disease. 9. Stroke in 2009. 10. Carotid disease.   PAST SURGICAL HISTORY:  1. Right inguinal hernia repair.  2. Dual chamber pacemaker placement.  3. Coronary artery bypass grafting.   DRUG ALLERGIES: No known drug allergies.   MEDICATIONS: 1. Aspirin 81 mg every other day.  2. Alprazolam 0.25 mg every eight hours as needed.  3. Bystolic 10 mg daily.  4. Lasix 40 mg daily.  5. Lisinopril 20 mg daily.  6. Metronidazole as needed. 7. Spironolactone 25 mg daily.  8. Terazosin 2  mg at bedtime.  9. Zocor 40 mg at bedtime.   SOCIAL HISTORY: No current alcohol or tobacco abuse.   FAMILY HISTORY: Apparently his father had heart disease.  REVIEW OF SYSTEMS: GENERAL: No fevers or chills. No unintentional weight loss or gain. EYES: No blurry or double vision. EARS: No tinnitus or ear pain. CARDIOVASCULAR: No chest pain or palpitations. RESPIRATORY: No shortness breath or cough. GASTROINTESTINAL: No nausea, vomiting, or diarrhea. GENITOURINARY: No dysuria or hematuria. Positive for chronic kidney disease. ENDOCRINE: No heat or cold intolerance. PSYCH: No anxiety or depression. NEUROLOGIC: As per history of present illness. SKIN: No new rash or ulcers.   PHYSICAL EXAMINATION:   GENERAL: This is an elderly white male who appears somewhat frail but in no apparent distress.   VITAL SIGNS: Temperature is 97.5, pulse 64, blood pressure 138/70, and saturations are 99% on room air.   HEAD: Normocephalic, atraumatic.   EYES: Sclerae anicteric. Conjunctivae are clear.   EARS: Normal external appearance. Hearing is intact.   NECK: Neck is supple without adenopathy or jugular venous distention. There is a left carotid bruit.   HEART: Irregularly, irregular with systolic murmur.   LUNGS: Clear without use of accessory muscles. No labored respirations.   ABDOMEN: Soft, nondistended, and nontender.   EXTREMITIES: He seems to have 5/5 strength in both upper and lower extremities. He reports some dyscoordination of fine motor issues on the right, but this is not easily appreciated on initial exam. Sensation is intact in both upper and lower extremities.   SKIN: Warm  and dry.  PULSES: He has poorly palpable pedal pulses, but his capillary refill is good. There are no signs of ischemia.  LABS/STUDIES: Sodium 146, potassium 4.3, chloride 110, CO2 26, BUN 30, creatinine 1.61, and glucose 96. His creatinine has improved from 2.3 on admission. White blood cell count 5.4, hemoglobin  10.9, platelet count 117,000.  Duplex ultrasound is as described above.   ASSESSMENT AND PLAN: This is an 81 year old white male with what appears to be at least a moderate left carotid artery stenosis with focal symptoms corresponding to this. The threshold for repair in this situation would generally be less than with asymptomatic patients and often times a 60 to 70% stenosis would still be considered reasonable for repair. Given his extensive cardiac history, he may not be a great open surgical candidate, but certainly carotid artery stenting could be considered. Usually we would get a CT angiogram for further evaluation, but this is not currently possible due to his renal insufficiency, and MR angiogram is not an option due to his pacemaker. As such a catheter-based angiogram should be performed for further evaluation to determine treatment options. I have discussed the risks and benefits of the procedure with the patient and his wife today. They are very hesitant to proceed and apparently are interested in talking this over amongst each other and with the family. I have discussed the importance of the situation and the high risk for continued or further symptoms if a moderate high-grade lesion which is symptomatic is indeed present. I will be happy to get him on the     schedule for catheter-based angiogram this afternoon or tomorrow if they would desire and it sounds as if they also want to talk this over with Dr. Sibyl Parr as well.  This is a level-4 consultation. ____________________________ Annice Needy, MD jsd:slb D: 03/01/2011 16:37:27 ET T: 03/02/2011 10:24:20 ET JOB#: [id number]  cc: Annice Needy, MD, <Dictator> Annice Needy MD ELECTRONICALLY SIGNED 03/14/2011 11:11

## 2014-05-30 NOTE — Consult Note (Signed)
Chief Complaint:  Subjective/Chief Complaint patient would like to procede with bilateral perc tubes after discussing further with wife, son   VITAL SIGNS/ANCILLARY NOTES: **Vital Signs.:   14-Jan-16 06:00  Vital Signs Type Routine  Temperature Temperature (F) 97.8  Celsius 36.5  Temperature Source oral  Pulse Pulse 78  Respirations Respirations 20  Systolic BP Systolic BP 003  Diastolic BP (mmHg) Diastolic BP (mmHg) 63  Mean BP 81  Pulse Ox % Pulse Ox % 92  Pulse Ox Activity Level  With exertion  *Intake and Output.:   Daily 14-Jan-16 07:00  Grand Totals Intake:  1516 Output:      Net:  7048 24 Hr.:  8891  IV (Primary)      In:  6945  Length of Stay Totals Intake:  1516 Output:      Net:  0388   Brief Assessment:  GEN well developed, critically ill appearing   Cardiac Regular   Respiratory normal resp effort   Gastrointestinal Normal   Gastrointestinal details normal Soft  Nontender  Nondistended  No rebound tenderness  No gaurding  No rigidity   EXTR negative cyanosis/clubbing   Additional Physical Exam Foley in place draining clear yellow urine   Lab Results: Hepatic:  14-Jan-16 04:14   Bilirubin, Total 0.7  Alkaline Phosphatase  279 (46-116 NOTE: New Reference Range 08/18/13)  SGPT (ALT)  106 (14-63 NOTE: New Reference Range 08/18/13)  SGOT (AST)  118  Total Protein, Serum 6.4  Albumin, Serum  2.5  Routine Chem:  14-Jan-16 04:14   Glucose, Serum  147  BUN  63  Creatinine (comp)  6.54  Sodium, Serum 144  Potassium, Serum 5.0  Chloride, Serum  109  CO2, Serum 25  Calcium (Total), Serum  8.2  Osmolality (calc) 308  eGFR (African American)  10  eGFR (Non-African American)  9 (eGFR values <28m/min/1.73 m2 may be an indication of chronic kidney disease (CKD). Calculated eGFR, using the MRDR Study equation, is useful in  patients with stable renal function. The eGFR calculation will not be reliable in acutely ill patients when serum creatinine  is changing rapidly. It is not useful in patients on dialysis. The eGFR calculation may not be applicable to patients at the low and high extremes of body sizes, pregnant women, and vegetarians.)  Anion Gap 10  Magnesium, Serum 2.3 (1.8-2.4 THERAPEUTIC RANGE: 4-7 mg/dL TOXIC: > 10 mg/dL  -----------------------)  Routine Hem:  14-Jan-16 04:14   WBC (CBC) 9.4  RBC (CBC)  3.81  Hemoglobin (CBC)  11.2  Hematocrit (CBC)  34.2  Platelet Count (CBC) 184  MCV 90  MCH 29.3  MCHC 32.6  RDW 14.5  Neutrophil % 88.6  Lymphocyte % 3.6  Monocyte % 7.6  Eosinophil % 0.0  Basophil % 0.2  Neutrophil #  8.3  Lymphocyte #  0.3  Monocyte # 0.7  Eosinophil # 0.0  Basophil # 0.0 (Result(s) reported on 11 Feb 2014 at 05:20AM.)   Assessment/Plan:  Invasive Device Daily Assessment of Necessity:  Does the patient currently have any of the following indwelling devices? foley   Indwelling Urinary Catheter continued, requirement due to   Reason to continue Indwelling Urinary Catheter renal failure, need for maximal decompression   Assessment/Plan:  Assessment 81yo M with advanced muscle invasive bladder cancer s/p chemo/ XRT 07/2013, bilateral ureteral stents with acute on chronic renal failure, malaise, nausea.  Cr remains elevated to 6.5 this AM despite Foley.  Bilateral mild/ moderate hydronphrosis depite stents, suspect  stent failure despite exchange 6 weeks ago.  We discussed bilateral percutaneous nephrostomy tube placement to optimize renal function.  Patient and his wife have agreed to procede with this after lengthy discussion.  IR to place tubes tomorrow (02/12/14).   Plan -NPO at [facility] -discussed case with IR today, able to accomidate patient tomorrow for procedure -preprocedure INR -please call with any questions or concerns   Electronic Signatures: Sherlynn Stalls (MD)  (Signed 14-Jan-16 09:55)  Authored: Chief Complaint, VITAL SIGNS/ANCILLARY NOTES, Brief Assessment, Lab Results,  Assessment/Plan   Last Updated: 14-Jan-16 09:55 by Sherlynn Stalls (MD)

## 2014-05-30 NOTE — H&P (Signed)
PATIENT NAME:  [name] [name], [name] [name] MR#:  [id number] DATE OF BIRTH:  06/09/1926  DATE OF ADMISSION:  02/10/2014  ADMITTING PHYSICIAN: Enid Baasadhika Jaxx Huish, M.D.   PRIMARY CARE PHYSICIAN: Stann MainlandDavid P. Sampson GoonFitzgerald, M.D.   PRIMARY ONCOLOGIST: Gerome SamJanak K. Choksi, M.D.   REASON FOR ADMISSION:  Dehydration and renal failure.   HISTORY OF PRESENT ILLNESS: [name] [name] is a very pleasant 81 year old Caucasian male with multiple past medical problems including coronary artery disease status post bypass graft surgery, ischemic cardiomyopathy, known EF of 50%, pacemaker for heart block, hypertension, congestive heart failure, peripheral vascular disease, history of CVA, CKD stage 3, baseline creatinine around 2.4, abdominal aortic aneurysm and transitional cell carcinoma of the bladder, locally invasive with right-sided hydronephrosis status post ureteral stent, who [facility] to poor p.o. intake, decreased urination, strong smelling urine per wife and noted to have acute renal failure with creatinine of 6.87, whereas his baseline is 2.3 and also potassium of 6. So, he was sent in for direct admit. The patient just complains of generalized fatigue and weakness. He finished his radiation for his bladder cancer in September 2015. In August 2015, he was started on chemotherapy but could not tolerate it due to side effects, so it was stopped at that time. He is currently only on observation for his bladder cancer. Recently, over the last several weeks, his appetite has been decreased, he has not been eating much but in the last week, his intake has been so low that they have been calling Dr. Doylene Canninghoksi almost frequently to complain of the same thing. Dr. Doylene Canninghoksi had a renal ultrasound done yesterday which did not show any worsening of his hydronephrosis.   Today, wife called again the [facility], stating that the patient has not been eating or drinking anything, so [facility]. His renal failure worsened with a creatinine of 6.87 and a potassium of 6.0, so he is being admitted for the same. The patient denies any chest pain or palpitations at this time. No fevers, chills at present.   PAST MEDICAL HISTORY:  1. Coronary artery disease status post bypass graft surgery.  2. Ischemic cardiomyopathy.  3. Congestive heart failure, last EF of 50%.  4. Pacemaker for heart block.  5. Diet-controlled diabetes mellitus.  6. Hypertension. 7. Hyperlipidemia. 8. Peripheral vascular disease.  9. History of CVA. 10. Chronic kidney disease stage 3, with baseline creatinine around 2.4.  11. Abdominal aortic aneurysm, 3 x 3 cm. 12. 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  13. Right-sided hydronephrosis status post right ureteral stent.   PAST SURGICAL HISTORY:  1. Transurethral resection of bladder tumor.  2. Right ureteral stent placement.  3. Hernia repair.  4. Coronary artery bypass graft surgery.  5. Cataract surgery.   ALLERGIES TO MEDICATIONS: No known drug allergies.   CURRENT HOME MEDICATIONS:  1. Zocor 40 mg p.o. daily.  2. Terazosin 2 mg p.o. daily.  3. Lisinopril 5 mg p.o. daily.  4. Lasix 40 mg p.o. daily.  5. Bystolic 5 mg p.o. daily.  6. Aspirin 81 mg p.o. daily.  7. Flagyl 250 mg daily p.r.n. for diverticulitis symptoms.  8. Xanax 0.25 mg p.r.n. for anxiety.  9. Aldactone 25 mg p.o. daily.  10. Tylenol PM p.r.n. for sleep.   SOCIAL HISTORY: Lives at home with his wife, used to be a Engineer, siteschool teacher in the past. No smoking or alcohol use.   FAMILY HISTORY: Sister with  stomach cancer. Hypertension runs in the family.   REVIEW OF SYSTEMS: CONSTITUTIONAL: No fever. Positive for fatigue, weakness.  EYES: No blurred vision, double vision, inflammation or glaucoma, positive for history of cataracts.  ENT: No tinnitus, ear pain, hearing loss, epistaxis or discharge.  RESPIRATORY: No cough, wheeze, hemoptysis, or COPD.   CARDIOVASCULAR: No chest pain, orthopnea, arrhythmia, palpitations or syncope.  GASTROINTESTINAL: Positive for nausea. No vomiting. No diarrhea, no abdominal pain, hematemesis, or melena.  GENITOURINARY: Decreased urination. Positive for dysuria and also suprapubic pain.  ENDOCRINE: No polyuria, nocturia, thyroid problems, heat or cold intolerance.  HEMATOLOGY: No anemia, easy bruising or bleeding.  SKIN: No acne, rash or lesions.  MUSCULOSKELETAL: No neck, back, shoulder pain, arthritis or gout.  NEUROLOGIC: No numbness, weakness, CVA, transient ischemic attack or seizures.  PSYCHOLOGICAL: No anxiety, insomnia or depression.   PHYSICAL EXAMINATION:  VITAL SIGNS: Temperature 97.2 degrees Fahrenheit, pulse 72, respirations 20, blood pressure 169/78, pulse oximetry 96% on room. GENERAL: Well-built, well-nourished male, lying in bed, not in any acute distress.  HEENT: Normocephalic, atraumatic. Pupils equal, round, reacting to light. Anicteric sclerae. Extraocular movements intact.  OROPHARYNX: Is clear without erythema, mass, or exudates. Very dry mucous membranes.  NECK: Is supple. No thyromegaly, JVD or carotid bruits. No lymphadenopathy.  LUNGS: Moving air bilaterally. No wheeze or crackles. No use of accessory muscles for breathing.  CARDIOVASCULAR: S1, S2, regular rate and rhythm. A 3/6 systolic murmur heard. No rubs or gallops.  ABDOMEN: Soft, nontender, nondistended. No hepatosplenomegaly. Normal bowel sounds.  EXTREMITIES: Trace pedal edema. No clubbing or cyanosis, 2+ dorsalis pedis pulses palpable bilaterally.  SKIN: No acne, rash or lesions.  LYMPHATICS: No cervical lymphadenopathy.  NEUROLOGIC: Cranial nerves intact. No focal motor or sensory deficits.  PSYCHIATRIC: The patient is awake, alert, oriented x3.   LABORATORY DATA:  His hemoglobin is 10.9.   Sodium 141, potassium of 6.0, chloride 108, bicarbonate 24, BUN 71, creatinine 6.87, glucose 101, and calcium of 8.2.  Ultrasound kidneys bilaterally done yesterday showing bilateral mild hydronephrosis, bilateral renal cysts, no bladder outlet obstruction noted.   ASSESSMENT AND PLAN: An 81 year old male with locally advanced muscle invasive transitional cell carcinoma of bladder status post chemoradiation, hypertension, coronary artery disease, peripheral vascular disease, cerebrovascular accident, chronic kidney disease with baseline creatinine 2.3, and abdominal aortic aneurysm, [facility] due to poor p.o. intake and nausea, noted acute renal failure and hyperkalemia.  1. Acute renal failure and hyperkalemia due to prerenal causes, likely dehydration. Underlying chronic kidney disease, baseline creatinine around 2.3, worsening creatinine up to 4.7 last week, now at 6.8 Renal ultrasound just done yesterday showing mild hydronephrosis.  No need to repeat the ultrasound. Foley catheter ordered. IV fluids. Monitor intake and output. Nephrology consult. Hold nephrotoxins and also hold his lisinopril, Lasix and Aldactone for now. Check urinalysis and urine cultures to rule out infection.  2. Hyperkalemia due to acute renal failure and also due to aldactone and lisinopril. Hold culprit medications. IV fluids. A repeat BMP and medications have been given to correct potassium.  3. Failure to thrive, malnutrition. Dietary consult ordered. Ensure for now. Also, added Remeron at bedtime.  4. Elevated liver function tests. Hold statin for now. PET scan in November showed possible liver metastases, but recent CT done January 2016 with no lesions seen. Monitor for now. Recheck in a.m.  5. Coronary artery disease status post bypass surgery, congestive heart failure. Last echo from 2013 with ejection fraction of 50%. Appears clinically dehydrated.  Continue fluids and monitor closely. Cardiology has been consulted by Dr. Doylene Canning. Continue Bystolic if blood pressure can tolerate. Hold lisinopril for now. The patient is  status post pacemaker for heart block history.  6. Bladder cancer, status post radiation, right ureteral stent for right-sided hydronephrosis. Was on chemotherapy but stopped in August 2015 due to side effects. At this time, on observation. Oncology consultation.  7. Deep vein thrombosis prophylaxis.  CODE STATUS: Full code at this time.   TIME SPENT ON ADMISSION: 50 minutes.    ____________________________ Enid Baas, MD rk:kl D: 02/10/2014 13:55:44 ET T: 02/10/2014 15:35:05 ET JOB#: [id number]  cc: Enid Baas, MD, <Dictator> Janak K. Doylene Canning, MD Stann Mainland. Sampson Goon, MD Verna Czech. Lonna Cobb, MD Lennox Pippins, MD Enid Baas MD ELECTRONICALLY SIGNED 02/18/2014 10:57

## 2014-05-30 NOTE — Consult Note (Signed)
   Present Illness 81 yo male with history of cad s/p cabg, history of diastollic heart failure with echo done in 2014 showing normal lv function, history of chronic enal insuffiency, history of bladder cancer with chemotherapy and radiation who was admitted with progressive fatigue. CXR showed a small right pleural effusion with no significant pulmonary edema. Patient denies chest pain . He has mild pitting edema in his lower extremeties. He denies chest pain or sob. Complains of severe weakness and fatigue. Labs reveal acute on chornic renal insuffiency.His serum creatinine is 6.87 up from 4.7 approximately 1 weeks ago.   Physical Exam:  GEN fatigued appearing   HEENT PERRL   NECK supple  No masses   RESP normal resp effort  no use of accessory muscles   CARD Regular rate and rhythm  Normal, S1, S2   ABD denies tenderness  normal BS  no Adominal Mass   LYMPH negative neck, negative axillae   EXTR negative cyanosis/clubbing, negative edema   SKIN normal to palpation   NEURO cranial nerves intact, motor/sensory function intact   PSYCH A+O to time, place, person   Review of Systems:  Subjective/Chief Complaint weakness and fatigue   General: No Complaints   Skin: No Complaints   ENT: No Complaints   Eyes: No Complaints   Neck: No Complaints   Respiratory: No Complaints   Cardiovascular: No Complaints   Gastrointestinal: No Complaints   Genitourinary: No Complaints   Vascular: No Complaints   Musculoskeletal: No Complaints   Neurologic: No Complaints   Hematologic: No Complaints   Endocrine: No Complaints   Psychiatric: No Complaints   Review of Systems: All other systems were reviewed and found to be negative   Medications/Allergies Reviewed Medications/Allergies reviewed   Family & Social History:  Family and Social History:  Family History Non-Contributory   Social History negative tobacco   EKG:  EKG NSR    No Known Allergies:     Impression Pt with cad s/p cabg, history of hypertension, hyperlipiemia, histoyr of aicd/pacemaker, history of chronic kidney disease, history of bladder cancer s/p xrt and chemo who was admitted with weakness and fatigue. CXR does not suggest florid pulmonary edema. Has acute onchronic renal insuffiency. Would continue with current meds and follow. will need nephrology evaluation.   Plan 1. Restart bystollic at 5 mg dialy 2. Hold lisinopril and spironolactone for now due to renal sinsuffiency 3. Rule out for mi 4. Further recs pending course   Electronic Signatures: Dalia HeadingFath, Aylee Littrell A (MD)  (Signed 13-Jan-16 19:29)  Authored: General Aspect/Present Illness, History and Physical Exam, Review of System, Family & Social History, EKG , Allergies, Impression/Plan   Last Updated: 13-Jan-16 19:29 by Dalia HeadingFath, Davion Meara A (MD)

## 2014-05-30 NOTE — Discharge Summary (Signed)
PATIENT NAME:  [name] [name], [name] [name] MR#:  [id number] DATE OF BIRTH:  08-12-1926  DATE OF ADMISSION:  02/10/2014 DATE OF DISCHARGE:  02/16/2014  ADMITTING DIAGNOSIS: Weakness, dehydration, acute renal failure.   DISCHARGE DIAGNOSES:  1.  Weakness due to acute renal failure.  2.  Nausea, vomiting, also likely due to acute renal failure.  3.  Acute renal failure on chronic renal failure due to bilateral hydronephrosis, status post bilateral nephrostomy tubes with improvement in his renal function as well as his symptoms.  4.  Poor appetite and failure to thrive.  5.  Hyperkalemia due to acute renal failure and bilateral hydronephrosis, now resolved.  6.  Elevated liver function tests, statins held. The patient had a PET scan in November with possible liver metastases, but CT scan of the abdomen done recently was negative for any lesions.  7.  Coronary artery disease, status post CABG.  8.  Bladder cancer, status post radiation, right ureteral stent. Now status post bilateral nephrostomy tubes, was on chemotherapy but stopped in August 2015.   CONSULTANTS: Javier Docker. Ubaldo Glassing, MD; Sherlynn Stalls, MD; Tama High, MD;   PERTINENT LABORATORIES AND EVALUATIONS: Admitting glucose 130, BUN 66, creatinine 6.65, sodium 143, potassium 5.2, chloride 111, CO2 of 24, calcium 8.2. Most recent creatinine on January 18 was 2.17. LFTs: Total protein 6.4, albumin 2.5, alk phos 279, AST 118, ALT 106. WBC 9.4, hemoglobin 11.2, platelet count was 184,000. Urine culture showed mixed bacterial organisms. UA showed 3+ blood, nitrites negative, leukocytes trace, Clostridium difficile was negative. Chest x-ray on admission showed no pulmonary edema.   HOSPITAL COURSE: Please refer to H and P done by the admitting physician. The patient is an 81 year old white male with a history of bladder cancer who was brought into the hospital with nausea and vomiting, failure to thrive, generalized weakness. Due to these symptoms, he was  seen in the ER, noted to have acute renal failure. He was seen in consultation by nephrology. He does have a history of bladder cancer. He was seen also in consultation by Dr. Oliva Bustard. The patient on further evaluation was noted to have bilateral hydronephrosis, which was thought to be the cause of his acute renal failure. He was seen in consultation by neurology and bilateral percutaneous nephrostomy tubes were placed. After having these placed, his renal function continued to improve and now it is at his baseline. The patient also was very nauseous and was throwing up, which has also improved. He is eating somewhat better. He had severe generalized weakness and was seen by physical therapy and he was able to ambulate. Therefore, he did not qualify for nursing home. At this time, home health has been arranged. He will follow up outpatient with urology. He will have the nephrostomy tubes in place. At this time,  he is stable for discharge.   DISCHARGE MEDICATIONS: Alprazolam 0.25 one tablet p.o. q. 8 p.r.n., acetaminophen/hydrocodone 325/5 half to one tablet p.o. q. 6 p.r.n. pain, gabapentin 100 one tablet p.o. t.i.d., aspirin 81 one tablet p.o. daily, Bystolic 5 daily, lisinopril 5 daily, metronidazole 250 daily as needed for diverticulitis (has taken previously), spironolactone 25 p.o. daily, terazosin 2 mg at bedtime, simvastatin 20 daily, Zofran 4 mg q. 4-6 p.r.n., trospium 20 mg daily, phenazopyridine 100 mg daily as needed, sertraline 25 p.o. daily, Tylenol PM at bedtime p.r.n., mirtazapine 15 at bedtime.   Home health nurse services are provided. The patient to have his nephrostomy tubes flushed with 10 mL of saline.  DIET: Low sodium, low fat, low cholesterol. Ensure 3 times a day.  ACTIVITY: As tolerated.  FOLLOWUP: With Dr. Bernardo Heater in 1-2 weeks; primary MD in 1-2 weeks.   TIME SPENT: Thirty-five minutes.    ____________________________ Lafonda Mosses Posey Pronto, MD shp:TT D: 02/17/2014 13:11:49  ET T: 02/17/2014 14:35:33 ET JOB#: [id number]  cc: Lorene Klimas H. Posey Pronto, MD, <Dictator> Alric Seton MD ELECTRONICALLY SIGNED 02/23/2014 12:44

## 2014-05-30 NOTE — Consult Note (Signed)
   Comments   I met with pt in the presence of his wife and 2 sons. Offered the pt the option of the Hospice Home and he and family are all in agreement. Hospice Home orders entered.   Electronic Signatures: Nyisha Clippard, Izora Gala (MD)  (Signed 27-Jan-16 12:28)  Authored: Palliative Care   Last Updated: 27-Jan-16 12:28 by Media Pizzini, Izora Gala (MD)

## 2014-05-30 NOTE — Discharge Summary (Signed)
PATIENT NAME:  [name] [name], [name] [name] MR#:  [id number] DATE OF BIRTH:  02/20/1926  DATE OF ADMISSION:  02/22/2014 DATE OF DISCHARGE: 02/25/2014  DISPOSITION: To hospice home.    CODE STATUS: DNR.  DISCHARGE MEDICATIONS: Nystatin 5 mL swish and swallow 4 times daily, Zofran 4 mg q. 6 hours as needed for nausea, Ativan 0.5 mg 1 to 2 tablets every 2 to 4 hours as needed for anxiety, morphine 20 mg/mL. The patient needs to take 0.5 mL every 1 to 2 hours for anxiety and pain.   CONSULTATIONS: Palliative care consult with Dr. Phifer.   HOSPITAL COURSE: An 81-year-old male patient who came in because of severe generalized weakness and altered mental status. He has history of bladder cancer, received radiation and chemotherapy. Previously, he was admitted from January 13 through January 19. He was admitted that time because of intractable nausea, vomiting, and renal failure. The patient received fluids and hydration, discharged home. However, the patient continued to have generalized weakness, decreased p.o. intake, nausea, vomiting, and difficulty swallowing. Concerning that, the patient's family brought him here. The patient had severe difficulty swallowing and not able to keep anything by mouth. Sodium was 151 on admission, BUN  was 76. He was admitted to the hospitalist service for severe dehydration with severe hypernatremia. He was started on IV fluids. The patient also was given Protonix IV b.i.d. for possible gastritis with nausea, vomiting. The patient had some altered mental status as per H and P, but it was thought to be secondary to dehydration. The patient's mental status improved the next day. He  continued to have severe decreased p.o. intake and hypernatremia was a concern. We changed IV fluids to D5 water.   Regarding dysphagia, we thought he could have thrush, so we started him on Diflucan. The patient's dysphagia did not improve. He was seen by speech therapy and she thought the patient has some  cognitive decline and not able to participate and does not want to eat because of his cognitive status. Speech recommended that he cannot maintain nutritional status by p.o. intake and family did not want any PEG. The patient was seen by palliative care, Dr. Phifer, regarding his advanced cancer, poor generalized physical status secondary to severe protein calorie malnutrition, and continued overall decline in health. The patient has history of bladder cancer, status post chemotherapy and radiation and has some residual tumor with bilateral hydronephrosis. A nephrostomy tube was placed in September. He has a history of CKD stage IV, and CVA, bypass, hypertension, hyperlipidemia.   The patient was seen by Dr. Phifer and because of his multiple medical problems and failure to thrive. Dr. Phifer met with the family and had a long discussion with the patient's two sons and the wife. They recognize that the patient is declining and they discussed the option of hospice home. The patient's CODE STATUS was changed to DNR. The patient was seen by the hospice nurse Jeannie and the patient's family was at the bedside day. They agreed for the patient to go to hospice home and patient went to the hospice home.  TIME SPENT ON DISCHARGE PREPARATION: More than 30 minutes.    ____________________________ Snehalatha Konidena, MD sk:ts D: 02/02/2014 10:26:00 ET T: 02/19/2014 23:20:23 ET JOB#: [id number]  cc: Snehalatha Konidena, MD, <Dictator> SNEHALATHA KONIDENA MD ELECTRONICALLY SIGNED 03/02/2014 13:29 

## 2014-05-30 NOTE — H&P (Signed)
PATIENT NAME:  [name] [name], [name] [name] MR#:  [id number] DATE OF BIRTH:  Feb 02, 1926  DATE OF ADMISSION:  02/22/2014  PRIMARY CARE PHYSICIAN: Stann Mainlandavid P. Sampson GoonFitzgerald, MD  REFERRING PHYSICIAN: Cecille AmsterdamJonathan E. Mayford KnifeWilliams, MD  CHIEF COMPLAINT: Severe generalized weakness, altered mental status.   HISTORY OF PRESENT ILLNESS: [name] [name] is an 81 year old male with a history of bladder cancer which was resected followed by radiation and chemotherapy. Also has a history of congestive heart failure, pacemaker placement, was found to have bilateral hydronephrosis secondary to benign prostatic hypertrophy. Had bilateral nephrostomy tubes placed in September 2015. The patient had a recent admission on 02/10/2014 and was discharged on 02/16/2014. During that time, the patient was found to be severely nauseated, intractable nausea and vomiting, and was found to have renal failure. The patient had nephrostomy tubes changed, continued with IV fluids with improvement of the kidney function, as well as some nausea. The patient was discharged home. Since the patient's discharge, the patient has been with severe generalized weakness, unable to tolerate any diet, has been having severe nausea. Also, the patient was noted to have difficulty swallowing, was found to have oral thrush. Concerning this, the patient was given nystatin without improvement. Per family, the patient was not even able to swallow the ice chips, which were causing him to have choking. As the patient's condition considerably worsened, the patient is brought to the Emergency Department. Workup in the Emergency Department, the patient is noted to have right lower lobe pneumonia. Unable to obtain any history from the patient. The history is mainly obtained from the patient's wife and son, who are at bedside. Did not have any fever; however, has been having cough since the patient is brought to the Emergency Department. The patient has been staying mostly in the bed. The patient is also  noted to have elevated sodium of 151, elevated BUN of 76.   PAST MEDICAL HISTORY:  1.  Coronary artery disease status post contact coronary artery bypass graft surgery.  2.  Ischemic cardiomyopathy.  3.  Congestive heart failure with EF of 50%.  4.  History of pacemaker placement for heart block.  5.  Diet-controlled diabetes mellitus.  6.  Hypertension.  7.  Hyperlipidemia.  8.  Peripheral vascular disease. 9.  History of CVA.  10.  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 11.  Abdominal aortic aneurysm 3x3 cm.  12.  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  13.  Right-sided hydronephrosis status post ureteral stent placement.   PAST SURGICAL HISTORY:  1.  TURP. 2.  Right ureteral stent placement.  3.  Hernia repair.  4.  Coronary artery bypass grafting. 5.  Cataract surgery.   ALLERGIES: No known drug allergies.   HOME MEDICATIONS:  1.  Tylenol PM 1 tablet at bedtime.  2.  Trospium 20 mg as needed.  3.  Terazosin 2 mg daily.  4.  Spironolactone 25 mg daily.  5.  Simvastatin 20 mg daily.  6.  Sertraline 25 mg once a day.  7.  Phenazopyridine 100 mg as needed.  8.  Zofran 4 mg every 4 to 6 hours as needed.  9.  Nystatin 100,000 units orally. 10.  Mirtazapine 15 mg daily.  11.  Flagyl 250 mg daily.  12.  Lisinopril 5 mg once a day.  13.  Neurontin 100 mg 3 times a day.  14.  Bystolic 5 mg once a day.  15.  Aspirin 81 mg once a day.  16.  Alprazolam 0.5 mg every 8 hours as needed.  17.  Percocet 5/325 mg 1 tablet every 6 hours as needed.   SOCIAL HISTORY: No history of smoking, drinking alcohol or using illicit drugs. Married, lives with his wife.   FAMILY HISTORY: Sister with stomach cancer. Hypertension runs in the family.   REVIEW OF SYSTEMS: Could not be obtained secondary to severe generalized weakness and altered mental status.   PHYSICAL EXAMINATION:  GENERAL: This is an ill-looking elderly male lying down in the bed, not in  distress.  VITAL SIGNS: Temperature 98.2, pulse 84, blood pressure 134/88, respiratory rate of 20, oxygen saturation is 97% on room air.  HEENT: Head normocephalic, atraumatic. There is no scleral icterus. Conjunctivae normal. Pupils equal and react to light. Mucous membranes dry.  NECK: Supple. No lymphadenopathy. No JVD. No carotid bruit. No thyromegaly.  CHEST: Has no focal tenderness. Decreased breath sounds in the lower lobe of the right lung.  HEART: S1, S2 regular. No murmurs are heard.  ABDOMEN: Bowel sounds plus. Soft, nontender, nondistended.  EXTREMITIES: Has swelling of the left ankle. NEUROLOGIC: The patient was not oriented to place, person, and time. No apparent cranial nerve abnormalities. Could not examine the motor and sensory.   LABORATORY DATA: CBC: WBC of 15.4, hemoglobin 12.7, platelet count of 262,000. BMP: BUN 78, creatinine of 2.58, sodium of 151, potassium of 5.2. Elevated LFTs: Alkaline phosphatase 309, ALT 119, AST 173, albumin of 1.7. BNP 2100.   IMAGING: Chest x-ray shows right lower lobe infiltrate.   ASSESSMENT AND PLAN:  [name] [name], an 81 year old with known history of bladder cancer, hydronephrosis, bilateral urostomy, comes with altered mental status.  1.  Altered mental status. He has a combination of factors: Hypernatremia, medications, infection, high BUN. Admit the patient to a medical bed. Continue with intravenous fluids and free water in order to bring down the sodium.  2.  Pneumonia, aspiration versus healthcare-associated pneumonia. Keep the patient on vancomycin and cefepime.  3.  Acute on chronic renal insufficiency. The patient's BUN significantly worsened since the last admission. Continue with intravenous fluids and follow up.  4.  Hyperkalemia, most likely secondary to dehydration. Continue to follow up.  5.  Elevated liver function tests. Will obtain right upper quadrant ultrasound.  6.  Severe debility. Continue with the physical therapy.  7.  Anemia of chrinic disease 8. Severe protein calorie malnutrition. will benefit placing dobhoff tube until nutritional status improves. 9.  Nausea and vomiting will need further w/u from GI stand point as the cause of the nausea and vomiting. per patient's wife was vomiting projectile and coffee ground. keep the patient on protonix BID. No curren signs of active bleeding. could be from [employer] tear or gastritis. 10. Bladder cancer- per oncology patient still has some residual. does not seem to be the cause of the patient's rapid decline. nausea and vomiting and malnutrition could be playing the role. 11..  Keep the patient on deep vein thrombosis prophylaxis with heparin.   TIME SPENT: 50 minutes.    ____________________________ Susa Griffins, MD pv:ST D: 02/23/2014 00:32:32 ET T: 02/23/2014 00:56:55 ET JOB#: [id number]  cc: Susa Griffins, MD, <Dictator> Susa Griffins MD ELECTRONICALLY SIGNED 02/23/2014 4:48

## 2014-05-30 NOTE — Consult Note (Signed)
Chief Complaint:  Subjective/Chief Complaint Cr slightly improved to 5.4=7 today, however Renal function overall very poor.  Nothing by mouth for bilateral nephrostomy tube placement today.  Complaining of loose stool, C. difficile negative.  Failure to thrive type picture.   VITAL SIGNS/ANCILLARY NOTES: **Vital Signs.:   15-Jan-16 08:36  Vital Signs Type pre procedure  Temperature Temperature (F) 98.4  Celsius 36.8  Pulse Pulse 82  Respirations Respirations 21  Systolic BP Systolic BP 540  Diastolic BP (mmHg) Diastolic BP (mmHg) 85  Mean BP 118  Pulse Ox % Pulse Ox % 93  Pulse Ox Activity Level  At rest  Oxygen Delivery Room Air/ 21 %  *Intake and Output.:   Daily 15-Jan-16 07:00  Grand Totals Intake:  240 Output:  2300    Net:  -2060 24 Hr.:  -2060  Oral Intake      In:  240  Urine ml     Out:  2300  Length of Stay Totals Intake:  1756 Output:  2300    Net:  -544   Brief Assessment:  GEN well developed, critically ill appearing, Sleeping during exam   Cardiac Regular   Respiratory normal resp effort   Gastrointestinal Normal   Gastrointestinal details normal Soft  Nontender  Nondistended  No rebound tenderness  No gaurding  No rigidity   EXTR negative cyanosis/clubbing   Additional Physical Exam Foley in place draining clear yellow urine   Lab Results: Hepatic:  15-Jan-16 04:32   Bilirubin, Total 0.5  Alkaline Phosphatase  241 (46-116 NOTE: New Reference Range 08/18/13)  SGPT (ALT)  96 (14-63 NOTE: New Reference Range 08/18/13)  SGOT (AST)  111  Total Protein, Serum  5.6  Albumin, Serum  2.2  Routine Chem:  15-Jan-16 04:32   Glucose, Serum  104  BUN  68  Creatinine (comp)  5.73  Sodium, Serum  147  Potassium, Serum 4.6  Chloride, Serum  116  CO2, Serum 23  Calcium (Total), Serum  7.4  Osmolality (calc) 312  eGFR (African American)  12  eGFR (Non-African American)  10 (eGFR values <46mL/min/1.73 m2 may be an indication of chronic kidney  disease (CKD). Calculated eGFR, using the MRDR Study equation, is useful in  patients with stable renal function. The eGFR calculation will not be reliable in acutely ill patients when serum creatinine is changing rapidly. It is not useful in patients on dialysis. The eGFR calculation may not be applicable to patients at the low and high extremes of body sizes, pregnant women, and vegetarians.)  Anion Gap 8  Routine Hem:  15-Jan-16 04:32   WBC (CBC) 7.3  RBC (CBC)  3.50  Hemoglobin (CBC)  10.2  Hematocrit (CBC)  32.1  Platelet Count (CBC)  145  MCV 92  MCH 29.2  MCHC  31.9  RDW 14.5  Neutrophil % 81.1  Lymphocyte % 6.2  Monocyte % 12.3  Eosinophil % 0.3  Basophil % 0.1  Neutrophil # 5.9  Lymphocyte #  0.5  Monocyte # 0.9  Eosinophil # 0.0  Basophil # 0.0 (Result(s) reported on 12 Feb 2014 at 05:53AM.)   Assessment/Plan:  Invasive Device Daily Assessment of Necessity:  Does the patient currently have any of the following indwelling devices? foley   Indwelling Urinary Catheter continued, requirement due to   Reason to continue Indwelling Urinary Catheter renal failure, need for maximal decompression   Assessment/Plan:  Assessment 81 yo M with advanced muscle invasive bladder cancer s/p chemo/ XRT 07/2013, bilateral ureteral stents  with acute on chronic renal failure, malaise, nausea.  Cr remains elevated to 5.7 today, slightly improved but overal poor.  Bilateral mild/ moderate hydronphrosis depite stents, suspect stent failure despite exchange 6 weeks ago.  We discussed bilateral percutaneous nephrostomy tube placement to optimize renal function.  He is scheduled to have this procedure today.  We discussed that following tube placement will be diagnostic and possibly therapeutic.  Certainly, if his creatinine improves after tube placement, we can be sure that the underlying etiology of his renal failure was indeed obstruction.  If not, tubes can always be clamped, removed at  any time.   Plan -NPO  -discussed case with IR today, on the schedule for this AM -please call with any questions or concerns -trend cr follow this procedure   Electronic Signatures: Sherlynn Stalls (MD)  (Signed 15-Jan-16 10:41)  Authored: Chief Complaint, VITAL SIGNS/ANCILLARY NOTES, Brief Assessment, Lab Results, Assessment/Plan   Last Updated: 15-Jan-16 10:41 by Sherlynn Stalls (MD)

## 2014-05-30 NOTE — Consult Note (Signed)
Chief Complaint:  Subjective/Chief Complaint Patient is overall greatly improved status post bilateral nephrostomy tubes.  He is tolerating a diet and no longer nauseous.His renal function quickly improved to 2.17 following placement of tubes.He is anticipating hopeful discharge tomorrow.   VITAL SIGNS/ANCILLARY NOTES: **Vital Signs.:   18-Jan-16 13:23  Vital Signs Type Routine  Temperature Temperature (F) 97.7  Celsius 36.5  Temperature Source oral  Pulse Pulse 79  Respirations Respirations 20  Systolic BP Systolic BP 536  Diastolic BP (mmHg) Diastolic BP (mmHg) 69  Mean BP 84  Pulse Ox % Pulse Ox % 95  Pulse Ox Activity Level  At rest  Oxygen Delivery Room Air/ 21 %  *Intake and Output.:   Daily 18-Jan-16 07:00  Grand Totals Intake:  683 Output:  850    Net:  -167 24 Hr.:  -167  Oral Intake      In:  0  IV (Primary)      In:  683  Other Output ml     Out:  425  Other Output ml     Out:  425  Length of Stay Totals Intake:  4752.82 Output:  6500    Net:  -1747.18   Brief Assessment:  GEN well developed, no acute distress, thin   Cardiac Regular   Respiratory normal resp effort   Gastrointestinal Normal   Gastrointestinal details normal Soft  Nontender  Nondistended   EXTR negative cyanosis/clubbing   Additional Physical Exam Bilateral nephrostomy tubes in place draining clear yellow urine   Lab Results: Routine Chem:  18-Jan-16 10:53   Glucose, Serum  147  BUN  30  Creatinine (comp)  2.17  Sodium, Serum 143  Potassium, Serum 4.0  Chloride, Serum  113  CO2, Serum 23  Calcium (Total), Serum  7.8  Anion Gap 7  Osmolality (calc) 294  eGFR (African American)  37  eGFR (Non-African American)  31 (eGFR values <51m/min/1.73 m2 may be an indication of chronic kidney disease (CKD). Calculated eGFR, using the MRDR Study equation, is useful in  patients with stable renal function. The eGFR calculation will not be reliable in acutely ill patients when serum  creatinine is changing rapidly. It is not useful in patients on dialysis. The eGFR calculation may not be applicable to patients at the low and high extremes of body sizes, pregnant women, and vegetarians.)   Assessment/Plan:  Invasive Device Daily Assessment of Necessity:  Does the patient currently have any of the following indwelling devices? foley   Indwelling Urinary Catheter continued, requirement due to   Reason to continue Indwelling Urinary Catheter renal failure, need for maximal decompression   Assessment/Plan:  Assessment 81yo M with advanced muscle invasive bladder cancer s/p chemo/ XRT 07/2013, right ureteral stent with acute on chronic renal failure, malaise, nausea.  Cr remains elevated to 5.7 today, slightly improved but overal poor.  Bilateral mild/ moderate hydronphrosis depite stent, suspect stent failure despite exchange 6 weeks ago.  He is now post procedure day 3 with dramatic improvement in his creatinine to 2.17 and is feeling much better.  Stents are draining well and is tolerating them.   Plan -Discussed outpatient follow-up with Dr. SBernardo Heater his Urologist -Will defer to his urologist on how to proceed with nephrostomy tubes and right stent which will likely need to be removed -We briefly discussed nephrostomy tube care  -[facility] and [facility] -Please  page with any questions or concerns, I will sign off   Electronic Signatures: Sherlynn Stalls (MD)  (Signed 18-Jan-16 18:31)  Authored: Chief Complaint, VITAL SIGNS/ANCILLARY NOTES, Brief Assessment, Lab Results, Assessment/Plan   Last Updated: 18-Jan-16 18:31 by Sherlynn Stalls (MD)

## 2014-05-30 NOTE — Consult Note (Signed)
PATIENT NAME:  [name], [name] MR#:  [id number] DATE OF BIRTH:  Mar 21, 1926  DATE OF CONSULTATION:  01/31/2014  REFERRING PHYSICIAN:  MunsoAssunta Foundor Lizabeth LeydenN. Lateef, MD  CONSULTING PHYSICIAN:  Claris GladdenAshley J. Adan Beal, MD, urology.  REASON FOR CONSULTATION: Acute renal failure, bilateral hydronephrosis,   HISTORY OF PRESENT ILLNESS: This is an 81 year old male with multiple medical problems, including CHF, CAD, and muscle invasive bladder cancer (T3), who underwent chemo and radiation, completed in July and September 2015. He was noted to have a residual persistent muscle invasive disease as recently as November 2015. He is a patient of Dr. Lonna CobbStoioff and [facility]. He also has a history of baseline CKD with a baseline creatinine ranging anywhere from 2.5 to 4.3. He has bilateral ureteral stents, which have been in place since at least June 2015 and were last changed 6 weeks ago by Dr. Lonna CobbStoioff, per the patient.   He presented today for routine follow-up in the cancer center and was noted to have significant weakness and nausea. Labs revealed worsening creatinine to 6.87 with evidence of hyperkalemia and elevated LFTs. He was admitted from the clinic for further management and work-up. He complains today of decreased energy, weakness, and nausea. He has had a poor appetite and poor p.o. intake.   PAST MEDICAL HISTORY: CKD stage IV, CHF, hyperlipidemia, history of MI, CVA, GI bleed, hypertension, status post cardiac stent, status post  pacemaker, status post cardiac catheterization, status post hernia repair, status post TURBT, and ureteral stent placement.   ALLERGIES: No known drug allergies.   HOME  MEDICATIONS: Please see admission H and P.   FAMILY HISTORY: Noncontributory.   SOCIAL HISTORY: Married and [place] He will be coming to the hospital in approximately an hour. No current smoking, alcohol, or other illicit drugs.   REVIEW OF SYSTEMS: A 12.  review of systems was performed and is positive for, as per HPI, leg swelling, weakness, otherwise negative.   PHYSICAL EXAMINATION:  GENERAL: No acute distress, alert and oriented x 3 pleasant, slightly frail-appearing. HEENT: Moist mucous membranes. Extraocular movements intact. Normocephalic, atraumatic.  NECK: Supple. No masses.  LUNGS: No increased work of breathing or respiratory distress.  CARDIOVASCULAR: No clubbing or cyanosis, 1+ bilateral lower extremity edema.  ABDOMEN: Soft, nontender, nondistended.  BLADDER: Bladder mass not palpable transabdominally in the suprapubic area.  SKIN: No rashes or concerning lesions.  GENITOURINARY: Foley catheter in place, draining clear yellow urine.   LABORATORY DATA: Creatinine 6.87, BUN 71, glucose is 105. LFTs elevated. Alkaline phosphatase 270, AST 142, ALT 128. Urinalysis is negative, other than 30 mg/dL, protein and 045101 red blood cells per high-powered field, and 7 white blood cells per high-powered field.   IMAGING STUDIES: Review of imaging reveals a renal ultrasound performed today, which shows mild-to-moderate hydronephrosis bilaterally. The bladder has significantly irregular contours with masses present. Bilateral ureteral stents can be seen, as well. There are some simple renal cysts.   ASSESSMENT AND PLAN: This is a 81 year old male with advanced muscle invasive bladder cancer, possible liver metastasis with chronic kidney disease and ureteral obstruction from this locally advanced disease managed by ureteral stents. These were just changed 6 weeks ago. He presents today with acute on chronic renal failure with evidence of bilateral hydronephrosis. He does have a Foley catheter in place to optimize his urinary drainage, which was placed today. I discussed with him that I am worried that his advanced disease is now obstructing  his ureteral catheters, and he may require percutaneous nephrostomy tubes for further drainage. I would like to  repeat his kidney function panel tomorrow morning to assess whether this is improved with IV hydration and placement of Foley catheter. If this is not improved, I would recommend placement of percutaneous nephrostomy tubes. We did have an extensive discussion today about not placing the tubes and allowing the cancer to progress naturally. We discussed uremia as well as options for hospice care. He states quite clearly that he has several books and things he would  like to do and is not yet ready to choose no intervention at this time.   RECOMMENDATIONS: 1. Repeat BMP in a.m.  2. Continue Foley catheter to maximize decompression. 3. N.P.O. at midnight.  4. If renal function fails to improve with the above measures, the patient would most likely like to proceed with bilateral percutaneous nephrostomy tubes in the morning.   I will see the patient again in the morning. Thank you for allowing me to participate in the care of this patient. Please page if you have any questions or concerns.    ____________________________ Claris Gladden, MD ajb:mw D: 02/10/2014 18:31:40 ET T: 02/10/2014 19:27:17 ET JOB#: [id number]  cc: Claris Gladden, MD, <Dictator> Claris Gladden MD ELECTRONICALLY SIGNED 03/03/2014 14:33

## 2015-12-16 IMAGING — CR DG CHEST 1V
1 series · 1 of 1 positions shown · non-contrast
Comparison: 01/29/2012

CLINICAL DATA: Shortness of breath, history of CABG

EXAM:
CHEST - 1 VIEW

[ap]
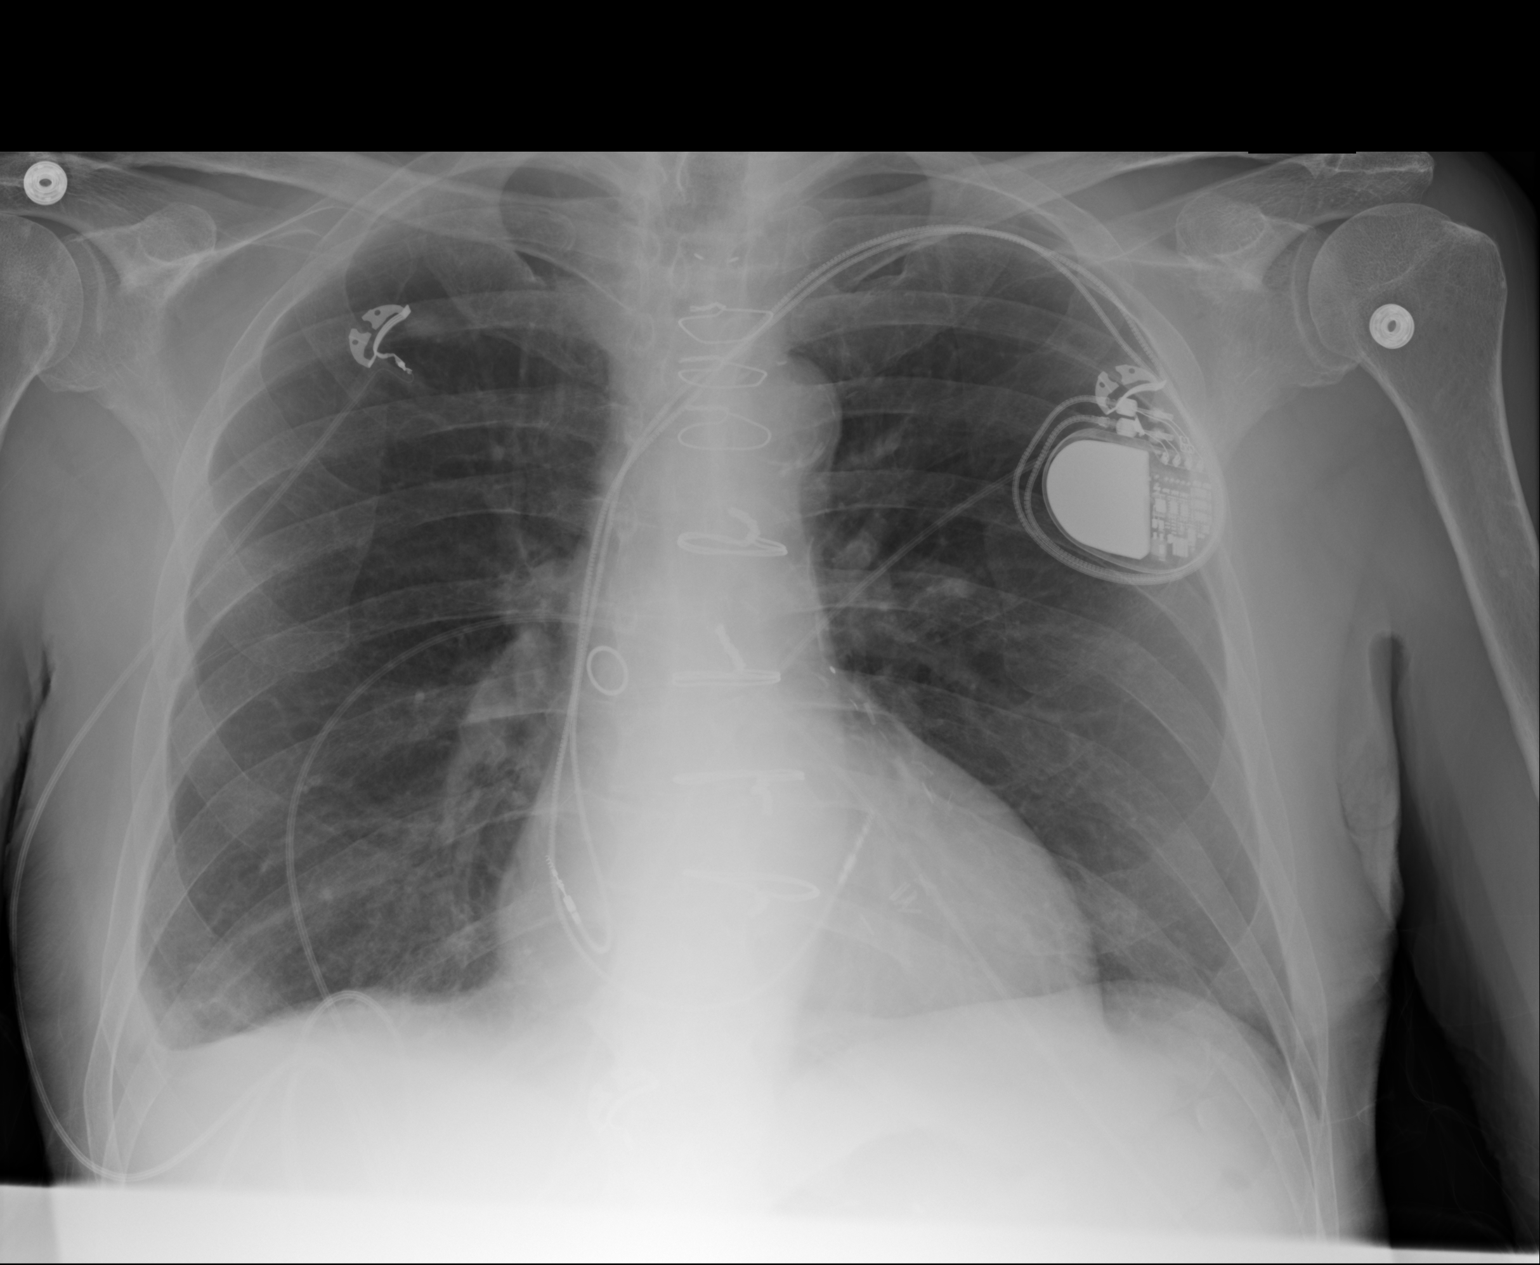

[1 of 1 positions shown; findings below may reference images not displayed]

FINDINGS: Cardiomediastinal silhouette is unremarkable. The patient is status
post CABG. No acute infiltrate or pulmonary edema. There is small
right pleural effusion with mild right basilar atelectasis. Blunting
of the right costophrenic angle. Dual lead cardiac pacemaker in
place with leads in right atrium and right ventricle.
IMPRESSION: No pulmonary edema. Status post CABG. Small right pleural effusion
with right basilar atelectasis. Blunting of the right costophrenic
angle. Dual lead cardiac pacemaker in place.

## 2015-12-28 IMAGING — CR DG CHEST 1V PORT
1 series · 1 of 1 positions shown · non-contrast
Comparison: Chest radiograph performed 02/10/2014

CLINICAL DATA: Acute onset of shortness of breath and weakness.
Initial encounter.

EXAM:
PORTABLE CHEST - 1 VIEW

[ap]
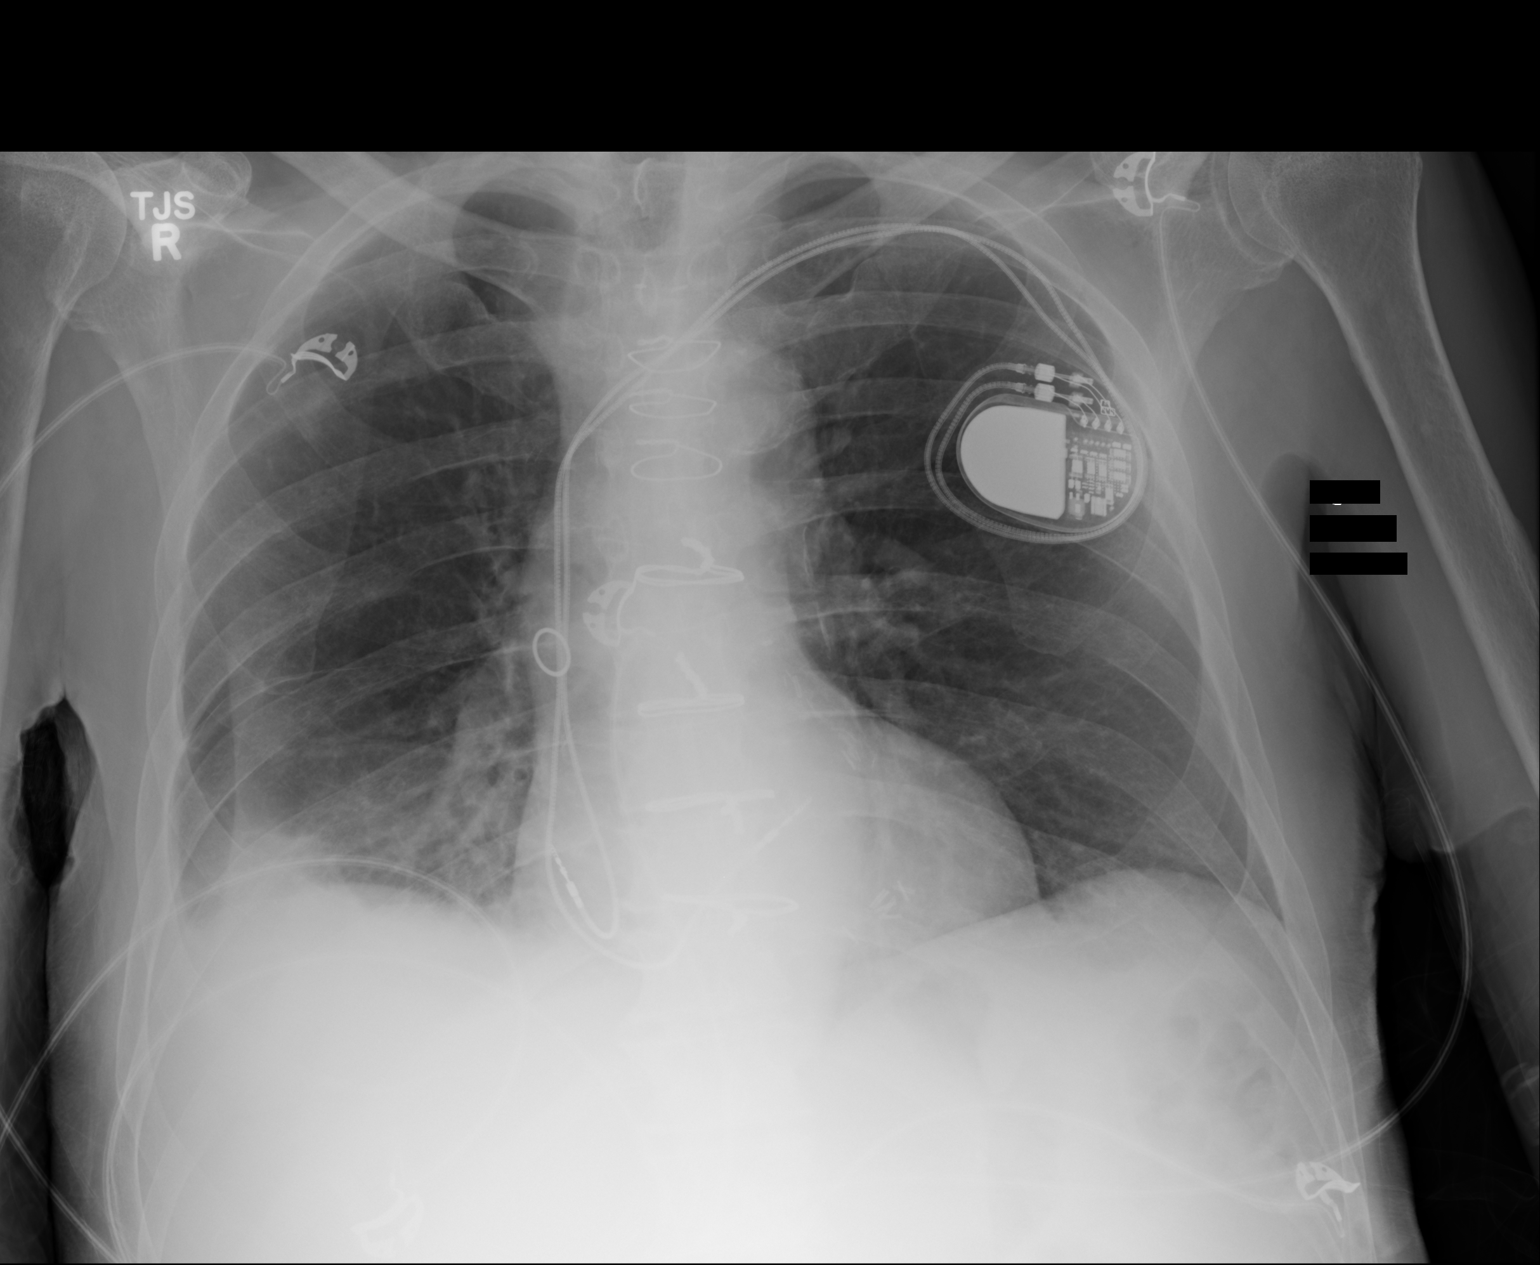

[1 of 1 positions shown; findings below may reference images not displayed]

FINDINGS: The lungs are well-aerated. A small right pleural effusion is noted.
Right basilar airspace opacification raises concern for pneumonia.
The left lung appears clear. No pneumothorax is identified.

The cardiomediastinal silhouette is normal in size. The patient is
status post median sternotomy. A pacemaker is noted overlying the
left chest wall, with leads ending overlying the right atrium and
right ventricle. No acute osseous abnormalities are seen.
IMPRESSION: Small right pleural effusion noted. Right basilar airspace
opacification raises concern for pneumonia. The appearance is less
typical for edema.
